# Patient Record
Sex: Female | Born: 1965 | Race: White | Hispanic: No | Marital: Married
Health system: Midwestern US, Community
[De-identification: ages and names within clinical notes are randomized; demographics above are authoritative.]

## PROBLEM LIST (undated history)

## (undated) DIAGNOSIS — R109 Unspecified abdominal pain: Secondary | ICD-10-CM

## (undated) DIAGNOSIS — H539 Unspecified visual disturbance: Secondary | ICD-10-CM

## (undated) DIAGNOSIS — E079 Disorder of thyroid, unspecified: Secondary | ICD-10-CM

## (undated) DIAGNOSIS — Q796 Ehlers-Danlos syndrome, unspecified: Secondary | ICD-10-CM

## (undated) DIAGNOSIS — K2970 Gastritis, unspecified, without bleeding: Secondary | ICD-10-CM

## (undated) DIAGNOSIS — F32A Depression, unspecified: Secondary | ICD-10-CM

## (undated) DIAGNOSIS — R079 Chest pain, unspecified: Secondary | ICD-10-CM

## (undated) DIAGNOSIS — H6990 Unspecified Eustachian tube disorder, unspecified ear: Secondary | ICD-10-CM

## (undated) DIAGNOSIS — G5750 Tarsal tunnel syndrome, unspecified lower limb: Secondary | ICD-10-CM

## (undated) DIAGNOSIS — M5136 Other intervertebral disc degeneration, lumbar region: Secondary | ICD-10-CM

## (undated) DIAGNOSIS — M5430 Sciatica, unspecified side: Secondary | ICD-10-CM

## (undated) DIAGNOSIS — M2550 Pain in unspecified joint: Secondary | ICD-10-CM

## (undated) DIAGNOSIS — M436 Torticollis: Secondary | ICD-10-CM

## (undated) DIAGNOSIS — F329 Major depressive disorder, single episode, unspecified: Secondary | ICD-10-CM

## (undated) DIAGNOSIS — K589 Irritable bowel syndrome without diarrhea: Secondary | ICD-10-CM

## (undated) DIAGNOSIS — R7989 Other specified abnormal findings of blood chemistry: Secondary | ICD-10-CM

## (undated) DIAGNOSIS — N939 Abnormal uterine and vaginal bleeding, unspecified: Secondary | ICD-10-CM

## (undated) DIAGNOSIS — Z01812 Encounter for preprocedural laboratory examination: Secondary | ICD-10-CM

## (undated) DIAGNOSIS — R52 Pain, unspecified: Secondary | ICD-10-CM

## (undated) HISTORY — DX: Unspecified eustachian tube disorder, unspecified ear: H69.90

## (undated) HISTORY — PX: HEMORRHOID SURGERY: SHX153

## (undated) HISTORY — DX: Chest pain, unspecified: R07.9

## (undated) HISTORY — DX: Major depressive disorder, single episode, unspecified: F32.9

## (undated) HISTORY — DX: Other intervertebral disc degeneration, lumbar region: M51.36

## (undated) HISTORY — PX: CHOLECYSTECTOMY: SHX55

## (undated) HISTORY — DX: Torticollis: M43.6

## (undated) HISTORY — DX: Gastritis, unspecified, without bleeding: K29.70

## (undated) HISTORY — DX: Tarsal tunnel syndrome, unspecified lower limb: G57.50

## (undated) HISTORY — DX: Other specified abnormal findings of blood chemistry: R79.89

## (undated) HISTORY — DX: Unspecified visual disturbance: H53.9

## (undated) HISTORY — PX: CORONARY ANGIOPLASTY: SHX604

## (undated) HISTORY — DX: Disorder of thyroid, unspecified: E07.9

## (undated) HISTORY — DX: Unspecified abdominal pain: R10.9

## (undated) HISTORY — DX: Sciatica, unspecified side: M54.30

## (undated) HISTORY — DX: Depression, unspecified: F32.A

## (undated) HISTORY — DX: Irritable bowel syndrome without diarrhea: K58.9

## (undated) HISTORY — DX: Pain in unspecified joint: M25.50

## (undated) HISTORY — PX: TONSILLECTOMY: SUR1361

## (undated) HISTORY — PX: TUBAL LIGATION: SHX77

## (undated) HISTORY — DX: Ehlers-Danlos syndrome, unspecified: Q79.60

---

## 1998-07-30 ENCOUNTER — Emergency Department (HOSPITAL_COMMUNITY): Admission: EM | Admit: 1998-07-30 | Discharge: 1998-07-30 | Payer: Self-pay | Admitting: Internal Medicine

## 2010-03-15 ENCOUNTER — Emergency Department (HOSPITAL_COMMUNITY): Admission: EM | Admit: 2010-03-15 | Discharge: 2010-03-15 | Payer: Self-pay | Admitting: Emergency Medicine

## 2011-02-27 LAB — COMPREHENSIVE METABOLIC PANEL
BUN: 15 mg/dL (ref 6–23)
CO2: 23 mEq/L (ref 19–32)
Chloride: 108 mEq/L (ref 96–112)
GFR calc non Af Amer: >60 mL/min (ref >60)
Glucose, Bld: 130 mg/dL — ABNORMAL HIGH (ref 70–99)
Potassium: 4 mEq/L (ref 3.5–5.1)
Sodium: 139 mEq/L (ref 135–145)
Total Bilirubin: 1.1 mg/dL (ref 0.3–1.2)
Total Protein: 7.5 g/dL (ref 6.0–8.3)

## 2011-10-25 ENCOUNTER — Ambulatory Visit
Admission: RE | Admit: 2011-10-25 | Discharge: 2011-10-25 | Disposition: A | Discharge status: Home / Self Care | Source: Ambulatory Visit | Attending: Family Medicine | Admitting: Family Medicine

## 2011-10-25 ENCOUNTER — Other Ambulatory Visit: Payer: Self-pay | Admitting: Family Medicine

## 2011-10-25 DIAGNOSIS — I2699 Other pulmonary embolism without acute cor pulmonale: Secondary | ICD-10-CM

## 2011-10-25 MED ORDER — IOHEXOL 300 MG/ML  SOLN
125.0000 mL | Freq: Once | INTRAMUSCULAR | Status: AC | PRN
  Administered 2011-10-25: 125 mL via INTRAVENOUS

## 2015-04-05 LAB — TSH: TSH: 0.79 u[IU]/mL (ref 0.41–5.90)

## 2015-12-18 ENCOUNTER — Encounter: Payer: Self-pay | Admitting: Internal Medicine

## 2015-12-19 ENCOUNTER — Encounter: Payer: Self-pay | Admitting: Internal Medicine

## 2015-12-19 ENCOUNTER — Ambulatory Visit (INDEPENDENT_AMBULATORY_CARE_PROVIDER_SITE_OTHER): Admitting: Internal Medicine

## 2015-12-19 VITALS — BP 114/68 | HR 88 | Temp 98.3°F | Resp 12 | Ht 67.0 in | Wt 210.8 lb

## 2015-12-19 DIAGNOSIS — E0789 Other specified disorders of thyroid: Secondary | ICD-10-CM | POA: Diagnosis not present

## 2015-12-19 DIAGNOSIS — R351 Nocturia: Secondary | ICD-10-CM

## 2015-12-19 DIAGNOSIS — E039 Hypothyroidism, unspecified: Secondary | ICD-10-CM | POA: Diagnosis not present

## 2015-12-19 LAB — T3, FREE: T3 FREE: 4.5 pg/mL — AB (ref 2.3–4.2)

## 2015-12-19 LAB — HEMOGLOBIN A1C: Hgb A1c MFr Bld: 5.5 % (ref 4.6–6.5)

## 2015-12-19 LAB — TSH: TSH: 0.63 u[IU]/mL (ref 0.35–4.50)

## 2015-12-19 LAB — T4, FREE: Free T4: 0.74 ng/dL (ref 0.60–1.60)

## 2015-12-19 NOTE — Progress Notes (Addendum)
Patient ID: Vickie Wood, female   DOB: Aug 10, 1966, 50 y.o.   MRN: [id number]   HPI  Vickie Wood is a 50 y.o.-year-old female, referred by her PCP, Dr Lysbeth Penner, for management of hypothyroidism. She saw endo before: Dr Robbie Louis.  Pt. has been dx with hypothyroidism in 2008 (fatigue, weight gain - 30 lbs over 3 mo, brain fog); is on Armour 120 mg. She was started initially on LT4 >> itching with different formulations.  Takes Armour: - fasting - with water - separated by >30 min from b'fast  - no calcium, iron, multivitamins  - prn PPIs  I reviewed pt's thyroid tests: Lab Results  Component Value Date   TSH 0.79 04/05/2015  04/05/2015: free T4  0.96 (0.82-1.77), free T3 3.8  (2-4.4) 06/24/2014: TSH 1.16, free T4 1.08 (0.82-1.77), free T3 6.1 (2-4.4)  Pt describes: - weight gain - fatigue - cold intolerance - depression - constipation - dry skin - hair loss - + nocturia x2  Pt denies feeling nodules in neck, hoarseness, dysphagia/odynophagia, SOB with lying down.  She has + FH of thyroid disorders in: GM and 2 cousins. No FH of thyroid cancer.  No h/o radiation tx to head or neck. No recent use of iodine supplements.  She has FH of DM2: grandfather and aunt.   ROS: Constitutional: + see HPI Eyes: no blurry vision, no xerophthalmia ENT: no sore throat, no nodules palpated in throat, + occasional dysphagia/no odynophagia, no hoarseness Cardiovascular: no CP/SOB/palpitations/leg swelling Respiratory: no cough/SOB Gastrointestinal: no N/V/D/+ C Musculoskeletal: no muscle/joint aches Skin: no rashes, + itching Neurological: no tremors/numbness/tingling/dizziness, + HA Psychiatric: + situational depression/no anxiety  Past Medical History  Diagnosis Date  . Thyroid disease   . Depressive disorder   . Tarsal tunnel syndrome   . Abnormal vision   . Eustachian tube disorder   . Gastritis   . IBS (irritable bowel syndrome)   . Joint pain     Multiple  .  Degeneration of lumbar intervertebral disc   . Intermittent torticollis   . Sciatica   . Chest pain   . Abdominal pain   . Abnormal C-reactive protein   Ehlers-Danlos sd.  Past Surgical History  Procedure Laterality Date  . Cesarean section    . Cholecystectomy    . Hemorrhoid surgery    . Tonsillectomy    . Tubal ligation     Social History   Social History  . Marital Status: Married    Spouse Name: N/A  . 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 Smoking status: Never Smoker   . Smokeless tobacco: Not on file  . Alcohol Use: 0.0 oz/week    0 Standard drinks or equivalent per week  . Drug Use: No   Current Outpatient Prescriptions on File Prior to Visit  Medication Sig Dispense Refill  . thyroid (ARMOUR) 120 MG tablet Take 120 mg by mouth daily before breakfast.     No current facility-administered medications on file prior to visit.   Allergies  Allergen Reactions  . Codeine Itching  . Synthroid [Levothyroxine Sodium] Itching    Uncontrollable    Family History  Problem Relation Age of Onset  . Heart disease Father   . Hypertension Mother   . Aneurysm Mother     Intracranial   . CVA Maternal Grandmother    Her daughter has a 9 year h/o Lyme ds, only recently dx'ed.  PE: BP 114/68 mmHg  Pulse 88  Temp(Src) 98.3 F (36.8 C) (Oral)  Resp 12  Ht 5\' 7"  (1.702 m)  Wt 210 lb 12.8 oz (95.618 kg)  BMI 33.01 kg/m2  SpO2 96% Wt Readings from Last 3 Encounters:  12/19/15 210 lb 12.8 oz (95.618 kg)   Constitutional: overweight, in NAD Eyes: PERRLA, EOMI, no exophthalmos ENT: moist mucous membranes, no thyromegaly, but L thyroid fulness, no cervical lymphadenopathy Cardiovascular: RRR, No MRG Respiratory: CTA B Gastrointestinal: abdomen soft, NT, ND, BS+ Musculoskeletal: no deformities, strength intact in all 4 Skin: moist, warm, no rashes Neurological: no tremor with outstretched hands, DTR  normal in all 4  ASSESSMENT: 1. Hypothyroidism  2. L Thyroid fulness  3. Nocturia  PLAN:  1. Patient with long-standing hypothyroidism, on Armour therapy. She appears euthyroid, but feels fatigued and is unhappy about her weight gain. She is interested to switch to a different desiccated thyroid extract and I suggested WP thyroid which is the purest. She agrees with the switch. - We discussed about correct intake of thyroid hh, fasting, with water, separated by at least 30 minutes from breakfast, and separated by more than 4 hours from calcium, iron, multivitamins, acid reflux medications (PPIs). She is taking it correctly. - will check thyroid tests today: TSH, free T4, free T3 and add TPO Abs to check for Hashimoto's thyroiditis - I explained that Hashimoto's thyroiditis is an autoimmune disease and the most common cause of hypothyroidism in [place]. - If these are abnormal, she will need to return in 6-8 weeks for repeat labs - If these are normal, I will see her back in 6 months  Needs 90 days supply sent to [facility].  2. L thyroid fulness - by palpation - + occasional dysphagia - she agrees to check a thyroid U/S >> ordered  3. Nocturia - has FH of DM2 - would like to be tested for DM - will check a HbA1c   Orders Placed This Encounter  Procedures  . US Soft Tissue Head/Neck  . T4, free  . TSH  . T3, free  . Hemoglobin A1c  . Thyroid peroxidase antibody    Component     Latest Ref Rng 12/19/2015  TSH     0.35 - 4.50 uIU/mL 0.63  Free T4     0.60 - 1.60 ng/dL 1.61  T3, Free     2.3 - 4.2 pg/mL 4.5 (H)  Hemoglobin A1C     4.6 - 6.5 % 5.5  Thyroperoxidase Ab SerPl-aCnc     <9 IU/mL 364 (H)   High TPO Ab's >> New dx of Hashimoto's thyroiditis. OTW, TFTs normal. fT4 a little high, which is not unusual during tx with desiccated thyroid extract. Will change to [facility] Thyroid as this is gluten-free, without coloring agents. Will start with the equivalent dose, 2 grains  (130 mg) daily. Will repeat TFTs in 5-6 weeks.  Thyroid U/S pending. I will addend the results when they become available.  CLINICAL DATA: Thyroid fullness  EXAM: THYROID ULTRASOUND  TECHNIQUE: Ultrasound examination of the thyroid gland and adjacent soft tissues was performed.  COMPARISON: None.  FINDINGS: There is moderate diffuse heterogeneity of thyroid parenchymal echotexture.  Right thyroid lobe  Measurements: Normal in size measuring 3.8 x 1.6 x 1.3 cm.  Right, inferior, posterior- 0.4 x 0.8 x 0.5 cm - hypoechoic, likely cystic  Left thyroid lobe  Measurements: Normal in size measuring 4.3 x 1.2 x 1.3 cm.  Left, inferior, exophytic -  0.6 x 0.6 x 0.4 cm - anechoic, likely cystic - it is unclear if this nodule is arising exophytically from the inferior aspect of the left lobe of the thyroid versus an adjacent discrete nodule / lymph node.  Isthmus  Thickness: Normal in size measuring 0.3 cm in diameter  No discrete nodule or mass identified within the thyroid isthmus.  Lymphadenopathy  None visualized.  IMPRESSION: Findings suggestive of multi nodular goiter. None of the discretely measured thyroid nodules currently meet imaging criteria to recommend percutaneous sampling.   Electronically Signed By: Simonne ComeJohn Watts M.D. On: 12/21/2015 11:38               I could not find Dr. Verlon AuHandshaw's name in the system to Route him the note.

## 2015-12-19 NOTE — Patient Instructions (Addendum)
Please stop at the lab.  Please continue Armour 120 mg for now.  Take the thyroid hormone every day, with water, at least 30 minutes before breakfast, separated by at least 4 hours from: - acid reflux medications - calcium - iron - multivitamins  Please come back for a follow-up appointment in 6 months.

## 2015-12-20 ENCOUNTER — Encounter: Payer: Self-pay | Admitting: *Deleted

## 2015-12-20 ENCOUNTER — Encounter: Payer: Self-pay | Admitting: Internal Medicine

## 2015-12-20 DIAGNOSIS — E039 Hypothyroidism, unspecified: Secondary | ICD-10-CM | POA: Insufficient documentation

## 2015-12-20 LAB — THYROID PEROXIDASE ANTIBODY: Thyroperoxidase Ab SerPl-aCnc: 364 IU/mL — ABNORMAL HIGH (ref <9)

## 2015-12-20 MED ORDER — WP THYROID 130 MG PO TABS: 130.0000 mg | ORAL_TABLET | Freq: Every day | ORAL | Status: DC

## 2015-12-21 ENCOUNTER — Ambulatory Visit
Admission: RE | Admit: 2015-12-21 | Discharge: 2015-12-21 | Disposition: A | Discharge status: Home / Self Care | Source: Ambulatory Visit | Attending: Internal Medicine | Admitting: Internal Medicine

## 2015-12-27 ENCOUNTER — Other Ambulatory Visit: Payer: Self-pay | Admitting: *Deleted

## 2015-12-27 MED ORDER — WP THYROID 130 MG PO TABS
130.0000 mg | ORAL_TABLET | Freq: Every day | ORAL | Status: DC
Start: 2015-12-27 — End: 2016-03-13

## 2015-12-27 NOTE — Telephone Encounter (Signed)
Per pt's request, resend [facility] Thyroid to [facility]. Pt still has not received it or heard from them. Done.

## 2016-01-17 ENCOUNTER — Telehealth: Payer: Self-pay | Admitting: Cardiovascular Disease

## 2016-01-17 NOTE — Telephone Encounter (Signed)
Received records from [facility] for appointment on 02/06/16 with Dr Duke Salvia.  Records given to [facility] (medical records) for Dr Leonides Sake schedule on 02/06/16. lp

## 2016-01-30 ENCOUNTER — Other Ambulatory Visit

## 2016-02-06 ENCOUNTER — Ambulatory Visit (INDEPENDENT_AMBULATORY_CARE_PROVIDER_SITE_OTHER)

## 2016-02-06 ENCOUNTER — Encounter: Payer: Self-pay | Admitting: Cardiovascular Disease

## 2016-02-06 ENCOUNTER — Ambulatory Visit (INDEPENDENT_AMBULATORY_CARE_PROVIDER_SITE_OTHER): Admitting: Cardiovascular Disease

## 2016-02-06 VITALS — BP 130/90 | HR 75 | Ht 67.0 in | Wt 214.2 lb

## 2016-02-06 DIAGNOSIS — R002 Palpitations: Secondary | ICD-10-CM

## 2016-02-06 DIAGNOSIS — Q796 Ehlers-Danlos syndrome, unspecified: Secondary | ICD-10-CM

## 2016-02-06 DIAGNOSIS — R079 Chest pain, unspecified: Secondary | ICD-10-CM | POA: Diagnosis not present

## 2016-02-06 DIAGNOSIS — R0602 Shortness of breath: Secondary | ICD-10-CM | POA: Diagnosis not present

## 2016-02-06 NOTE — Progress Notes (Signed)
Cardiology Office Note   Date:  02/08/2016   ID:  [name], DOB 12-07-1966, MRN [id number]  PCP:  No PCP Per Patient  Cardiologist:   Madilyn Hook, MD   Chief Complaint  Patient presents with  . New Evaluation    cardiac arrhythmia per dr rivard//patient reports shortness of breath with minimal exertion, swelling of hands, an occasional pain/tightness right (points right below sternum), and exhausted all the time. patient reports a family hx of cardiac arrhythmia (father and half-sister)      History of Present Illness: Vickie Wood is a 50 y.o. female with Vickie Wood and hypothyroidism who presents for evaluation of an irregular heart rhythm.  Vickie Wood saw her OB/GYN, Dr. Silverio Lay, on 01/11/16.  On exam she was noted to have an irregular heart rhythm.  She was referred to cardiology for further evaluation.  Vickie Wood reports feeling fatigued for the last year and a half.  She denies palpitations, lightheadedness,  nausea or diaphoresis.  She sometimes notes a sharp pain in her sternum that occurs both at rest and with exertion.  She has noticed shortness of breath for the last 3 months.  She doesn't remember being told she had an irregular heart rhythm in the past.  Her father was diagnosed with atrial fibrillation in his 24s and she has a half sister with an unknown arrhythmia.  She drinks coffee and tea but has significantly reduced her intake since finding out about the arrhythmia.   Past Medical History  Diagnosis Date  . Thyroid disease   . Depressive disorder   . Tarsal tunnel syndrome   . Abnormal vision   . Eustachian tube disorder   . Gastritis   . IBS (irritable bowel syndrome)   . Joint pain     Multiple  . Degeneration of lumbar intervertebral disc   . Intermittent torticollis   . Sciatica   . Chest pain   . Abdominal pain   . Abnormal C-reactive protein   . Ehlers-Danlos disease 02/08/2016    Past Surgical History  Procedure Laterality Date  .  Cesarean section    . Cholecystectomy    . Hemorrhoid surgery    . Tonsillectomy    . Tubal ligation       Current Outpatient Prescriptions  Medication Sig Dispense Refill  . WP THYROID 130 MG tablet Take 1 tablet (130 mg total) by mouth daily. 90 tablet 1   No current facility-administered medications for this visit.    Allergies:   Codeine and Synthroid    Social History:  The patient  reports that she has never smoked. She does not have any smokeless tobacco history on file. She reports that she drinks alcohol. She reports that she does not use illicit drugs.   Family History:  The patient's family history includes Aneurysm in her mother; Arrhythmia in her father; CVA in her maternal grandmother; Heart disease in her father; Hypertension in her mother.    ROS:  Please see the history of present illness.   Otherwise, review of systems are positive for none.   All other systems are reviewed and negative.    PHYSICAL EXAM: VS:  BP 130/90 mmHg  Pulse 75  Ht  (1.702 m)  Wt 97.16 kg (214 lb 3.2 oz)  BMI 33.54 kg/m2 , BMI Body mass index is 33.54 kg/(m^2). GENERAL:  Well appearing HEENT:  Pupils equal round and reactive, fundi not visualized, oral mucosa unremarkable NECK:  No jugular venous distention, waveform within normal limits, carotid upstroke brisk and symmetric, no bruits, no thyromegaly LYMPHATICS:  No cervical adenopathy LUNGS:  Clear to auscultation bilaterally HEART:  RRR.  PMI not displaced or sustained,S1 and S2 within normal limits, no S3, no S4, no clicks, no rubs, no murmurs ABD:  Flat, positive bowel sounds normal in frequency in pitch, no bruits, no rebound, no guarding, no midline pulsatile mass, no hepatomegaly, no splenomegaly EXT:  2 plus pulses throughout, no edema, no cyanosis no clubbing SKIN:  No rashes no nodules NEURO:  Cranial nerves II through XII grossly intact, motor grossly intact throughout PSYCH:  Cognitively intact, oriented to person  place and time   EKG:  EKG is ordered today. The ekg ordered today demonstrates sinus rhythm.  Rate 75 bpm.     Recent Labs: 12/19/2015: TSH 0.63    Lipid Panel No results found for: CHOL, TRIG, HDL, CHOLHDL, VLDL, LDLCALC, LDLDIRECT    Wt Readings from Last 3 Encounters:  02/06/16 97.16 kg (214 lb 3.2 oz)  12/19/15 95.618 kg (210 lb 12.8 oz)      ASSESSMENT AND PLAN:  # Irregular heart rate: Ms. Rankin does not report palpitations but her OB/GYN noted irregularity on exam.  Given that her father was diagnosed with atrial fibrillation in his 50s, we will obtain a 30 day Event Monitor to evaluate for arrhythmias. Her thyroid function has been stable.  We will also check a basic metabolic panel, CBC, and magnesium.  # Chest pain, shortness of breath:  Vickie Wood symptoms are atypical.  However, we will obtain a Lexiscan Cardiolite to evaluate for ischemia.  # Carylon Perches Danlos: Echo to evaluate for MVP or abnormalities of the aorta.   Current medicines are reviewed at length with the patient today.  The patient does not have concerns regarding medicines.  The following changes have been made:  no change  Labs/ tests ordered today include:   Orders Placed This Encounter  Procedures  . Basic metabolic panel  . CBC  . Magnesium  . Myocardial Perfusion Imaging  . Cardiac event monitor  . EKG 12-Lead  . ECHOCARDIOGRAM COMPLETE     Disposition:   FU with Ylonda Storr C. Duke Salvia, MD, [facility] in 3 months.   This note was written with the assistance of speech recognition software.  Please excuse any transcriptional errors.  Signed, Vickie Wood C. Duke Salvia, MD, [facility]  02/08/2016 9:49 PM    [facility]

## 2016-02-06 NOTE — Patient Instructions (Signed)
Medication Instructions: Dr Duke Salvia recommends that you continue on your current medications as directed. Please refer to the Current Medication list given to you today.  Labwork: Your physician recommends that you return for lab work TODAY.  Testing/Procedures: 1. Echocardiogram - Your physician has requested that you have an echocardiogram. Echocardiography is a painless test that uses sound waves to create images of your heart. It provides your doctor with information about the size and shape of your heart and how well your heart's chambers and valves are working. This procedure takes approximately one hour. There are no restrictions for this procedure.  2. Lexiscan Cardiolite stress test - Your physician has requested that you have a lexiscan myoview. For further information please visit [email or web address] Please follow instruction sheet, as given.  3. 30-Day Cardiac Event Monitor - Your physician has recommended that you wear an event monitor. Event monitors are medical devices that record the heart's electrical activity. Doctors most often [place] these monitors to diagnose arrhythmias. Arrhythmias are problems with the speed or rhythm of the heartbeat. The monitor is a small, portable device. You can wear one while you do your normal daily activities. This is usually used to diagnose what is causing palpitations/syncope (passing out). This will be mailed to you for you to put on at your convenience.  Follow-up: Dr Duke Salvia recommends that you schedule a follow-up appointment in 3 months.  If you need a refill on your cardiac medications before your next appointment, please call your pharmacy.

## 2016-02-08 ENCOUNTER — Encounter: Payer: Self-pay | Admitting: Cardiovascular Disease

## 2016-02-08 DIAGNOSIS — Q796 Ehlers-Danlos syndrome, unspecified: Secondary | ICD-10-CM

## 2016-02-08 HISTORY — DX: Ehlers-Danlos syndrome, unspecified: Q79.60

## 2016-02-12 ENCOUNTER — Telehealth: Payer: Self-pay | Admitting: Cardiovascular Disease

## 2016-02-12 NOTE — Telephone Encounter (Signed)
New message  Pt request a call back to discuss if her labs that are being drawn at [facility] can be used for her heart care needs. Please call back to discus in detail

## 2016-02-13 ENCOUNTER — Other Ambulatory Visit (INDEPENDENT_AMBULATORY_CARE_PROVIDER_SITE_OTHER)

## 2016-02-13 DIAGNOSIS — E039 Hypothyroidism, unspecified: Secondary | ICD-10-CM | POA: Diagnosis not present

## 2016-02-13 LAB — TSH: TSH: 3.13 u[IU]/mL (ref 0.35–4.50)

## 2016-02-13 LAB — T3, FREE: T3, Free: 4.8 pg/mL — ABNORMAL HIGH (ref 2.3–4.2)

## 2016-02-13 LAB — T4, FREE: Free T4: 0.73 ng/dL (ref 0.60–1.60)

## 2016-02-14 LAB — CBC
HEMATOCRIT: 41.4 % (ref 36.0–46.0)
Hemoglobin: 14.2 g/dL (ref 12.0–15.0)
MCH: 31.6 pg (ref 26.0–34.0)
MCHC: 34.3 g/dL (ref 30.0–36.0)
MCV: 92.2 fL (ref 78.0–100.0)
MPV: 10.3 fL (ref 8.6–12.4)
Platelets: 347 10*3/uL (ref 150–400)
RBC: 4.49 MIL/uL (ref 3.87–5.11)
RDW: 14 % (ref 11.5–15.5)
WBC: 8.4 10*3/uL (ref 4.0–10.5)

## 2016-02-14 LAB — BASIC METABOLIC PANEL
BUN: 12 mg/dL (ref 7–25)
CO2: 22 mmol/L (ref 20–31)
Calcium: 9.6 mg/dL (ref 8.6–10.2)
Chloride: 103 mmol/L (ref 98–110)
Creat: 0.8 mg/dL (ref 0.50–1.10)
GLUCOSE: 86 mg/dL (ref 65–99)
POTASSIUM: 4.6 mmol/L (ref 3.5–5.3)
Sodium: 136 mmol/L (ref 135–146)

## 2016-02-14 LAB — MAGNESIUM: Magnesium: 2 mg/dL (ref 1.5–2.5)

## 2016-02-16 ENCOUNTER — Telehealth: Payer: Self-pay | Admitting: Cardiovascular Disease

## 2016-02-16 NOTE — Telephone Encounter (Signed)
Spoke with pt, she reports as soon as she put the heart monitor on she developed a headache. She reports a burning type sensation in her head that is making her squint. She has a hx of headaches but states this one is different than her usual. She denies any other symptoms. Discussed with dr Duke Salviarandolph, no sure related to the monitor. Aware do not think headache is due to monitor. She will call with continued problems.

## 2016-02-27 ENCOUNTER — Telehealth (HOSPITAL_COMMUNITY): Payer: Self-pay | Admitting: *Deleted

## 2016-02-27 NOTE — Telephone Encounter (Signed)
Patient given detailed instructions per Myocardial Perfusion Study Information Sheet for the test on 02/29/16 at 1000. Patient notified to arrive 15 minutes early and that it is imperative to arrive on time for appointment to keep from having the test rescheduled.  If you need to cancel or reschedule your appointment, please call the office within 24 hours of your appointment. Failure to do so may result in a cancellation of your appointment, and a $50 no show fee. Patient verbalized understanding.Prisma Decarlo, Adelene IdlerCynthia W

## 2016-02-29 ENCOUNTER — Ambulatory Visit (HOSPITAL_COMMUNITY): Attending: Cardiovascular Disease

## 2016-02-29 ENCOUNTER — Other Ambulatory Visit: Payer: Self-pay

## 2016-02-29 ENCOUNTER — Ambulatory Visit (HOSPITAL_BASED_OUTPATIENT_CLINIC_OR_DEPARTMENT_OTHER)

## 2016-02-29 DIAGNOSIS — R9439 Abnormal result of other cardiovascular function study: Secondary | ICD-10-CM | POA: Diagnosis not present

## 2016-02-29 DIAGNOSIS — R079 Chest pain, unspecified: Secondary | ICD-10-CM | POA: Diagnosis present

## 2016-02-29 DIAGNOSIS — I5189 Other ill-defined heart diseases: Secondary | ICD-10-CM | POA: Insufficient documentation

## 2016-02-29 DIAGNOSIS — I340 Nonrheumatic mitral (valve) insufficiency: Secondary | ICD-10-CM | POA: Diagnosis not present

## 2016-02-29 DIAGNOSIS — I517 Cardiomegaly: Secondary | ICD-10-CM | POA: Diagnosis not present

## 2016-02-29 DIAGNOSIS — R0602 Shortness of breath: Secondary | ICD-10-CM | POA: Insufficient documentation

## 2016-02-29 DIAGNOSIS — I071 Rheumatic tricuspid insufficiency: Secondary | ICD-10-CM | POA: Diagnosis not present

## 2016-02-29 LAB — MYOCARDIAL PERFUSION IMAGING
CHL CUP NUCLEAR SRS: 14
CHL CUP NUCLEAR SSS: 16
CSEPPHR: 110 {beats}/min
LHR: 0.36
LV dias vol: 102 mL (ref 46–106)
LVSYSVOL: 40 mL
NUC STRESS TID: 0.95
Rest HR: 77 {beats}/min
SDS: 2

## 2016-02-29 MED ORDER — REGADENOSON 0.4 MG/5ML IV SOLN
0.4000 mg | Freq: Once | INTRAVENOUS | Status: AC
  Administered 2016-02-29: 0.4 mg via INTRAVENOUS

## 2016-02-29 MED ORDER — TECHNETIUM TC 99M SESTAMIBI GENERIC - CARDIOLITE
32.8000 | Freq: Once | INTRAVENOUS | Status: AC | PRN
  Administered 2016-02-29: 32.8 via INTRAVENOUS

## 2016-02-29 MED ORDER — TECHNETIUM TC 99M SESTAMIBI GENERIC - CARDIOLITE
10.6000 | Freq: Once | INTRAVENOUS | Status: AC | PRN
Start: 2016-02-29 — End: 2016-02-29
  Administered 2016-02-29: 11 via INTRAVENOUS

## 2016-02-29 MED ORDER — AMINOPHYLLINE 25 MG/ML IV SOLN
75.0000 mg | Freq: Once | INTRAVENOUS | Status: AC
  Administered 2016-02-29: 75 mg via INTRAVENOUS

## 2016-03-04 ENCOUNTER — Telehealth: Payer: Self-pay | Admitting: Cardiovascular Disease

## 2016-03-04 DIAGNOSIS — R9439 Abnormal result of other cardiovascular function study: Secondary | ICD-10-CM

## 2016-03-04 NOTE — Telephone Encounter (Signed)
Results released to mychart, pt had not yet received a call about these and had questions about the results. i answered as best as I was able - she would probably still benefit from a follow up phone call.  Pt aware I have put in the order for the cardiac CT & [place] imaging scheduler will call.

## 2016-03-04 NOTE — Telephone Encounter (Signed)
Pt had 2 test last week. She saw it on My-Chart,but she is still confused about what it was saying.

## 2016-03-05 NOTE — Telephone Encounter (Signed)
Spoke with patient and she will await call from scheduling and proceed with CT-A

## 2016-03-06 ENCOUNTER — Encounter: Payer: Self-pay | Admitting: Cardiovascular Disease

## 2016-03-07 ENCOUNTER — Inpatient Hospital Stay: Admission: RE | Admit: 2016-03-07 | Source: Ambulatory Visit

## 2016-03-12 ENCOUNTER — Encounter (HOSPITAL_COMMUNITY): Payer: Self-pay

## 2016-03-12 ENCOUNTER — Ambulatory Visit (HOSPITAL_COMMUNITY)
Admission: RE | Admit: 2016-03-12 | Discharge: 2016-03-12 | Disposition: A | Discharge status: Home / Self Care | Source: Ambulatory Visit | Attending: Cardiovascular Disease | Admitting: Cardiovascular Disease

## 2016-03-12 DIAGNOSIS — I2510 Atherosclerotic heart disease of native coronary artery without angina pectoris: Secondary | ICD-10-CM | POA: Insufficient documentation

## 2016-03-12 DIAGNOSIS — R9439 Abnormal result of other cardiovascular function study: Secondary | ICD-10-CM | POA: Diagnosis not present

## 2016-03-12 MED ORDER — NITROGLYCERIN 0.4 MG SL SUBL
0.8000 mg | SUBLINGUAL_TABLET | Freq: Once | SUBLINGUAL | Status: AC
  Administered 2016-03-12: 0.8 mg via SUBLINGUAL
  Filled 2016-03-12: qty 25

## 2016-03-12 MED ORDER — NITROGLYCERIN 0.4 MG SL SUBL
SUBLINGUAL_TABLET | SUBLINGUAL | Status: AC
  Filled 2016-03-12: qty 2

## 2016-03-12 MED ORDER — METOPROLOL TARTRATE 1 MG/ML IV SOLN
5.0000 mg | Freq: Once | INTRAVENOUS | Status: AC
  Administered 2016-03-12: 5 mg via INTRAVENOUS
  Filled 2016-03-12: qty 5

## 2016-03-12 MED ORDER — IOPAMIDOL (ISOVUE-370) INJECTION 76%
INTRAVENOUS | Status: AC
  Administered 2016-03-12: 80 mL
  Filled 2016-03-12: qty 100

## 2016-03-12 MED ORDER — METOPROLOL TARTRATE 1 MG/ML IV SOLN
INTRAVENOUS | Status: AC
  Filled 2016-03-12: qty 5

## 2016-03-13 ENCOUNTER — Other Ambulatory Visit: Payer: Self-pay | Admitting: *Deleted

## 2016-03-13 ENCOUNTER — Telehealth: Payer: Self-pay | Admitting: Cardiovascular Disease

## 2016-03-13 ENCOUNTER — Other Ambulatory Visit: Payer: Self-pay

## 2016-03-13 DIAGNOSIS — R0602 Shortness of breath: Secondary | ICD-10-CM

## 2016-03-13 DIAGNOSIS — R931 Abnormal findings on diagnostic imaging of heart and coronary circulation: Secondary | ICD-10-CM

## 2016-03-13 DIAGNOSIS — Z01810 Encounter for preprocedural cardiovascular examination: Secondary | ICD-10-CM

## 2016-03-13 DIAGNOSIS — Z79899 Other long term (current) drug therapy: Secondary | ICD-10-CM

## 2016-03-13 NOTE — Telephone Encounter (Addendum)
Returned call. 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  I do see Dr. Leonides Sakeandolph's notes recommending cath. Gave recommendations to patient - she is aware we will call to schedule cath.

## 2016-03-13 NOTE — Telephone Encounter (Signed)
NewMessage  Pt requested to speak w/ RN cocnerning test she had yesterday 4/4. Please call back and discuss.

## 2016-03-13 NOTE — Telephone Encounter (Signed)
Deedee called to give pt pre-cath instructions - pt scheduled for cath tomorrow, plan for her to arrive early for labwork.  Pt voiced understanding, and is trying to make sure husband will be there. She will call back to confirm.  Called Trish and she is aware patient on schedule & of situation.

## 2016-03-13 NOTE — Telephone Encounter (Signed)
Cath orders entered by BelgiumJenna.

## 2016-03-14 LAB — BASIC METABOLIC PANEL
BUN: 11 mg/dL (ref 7–25)
CALCIUM: 9.2 mg/dL (ref 8.6–10.2)
CHLORIDE: 103 mmol/L (ref 98–110)
CO2: 24 mmol/L (ref 20–31)
CREATININE: 0.79 mg/dL (ref 0.50–1.10)
GLUCOSE: 84 mg/dL (ref 65–99)
Potassium: 4.5 mmol/L (ref 3.5–5.3)
Sodium: 137 mmol/L (ref 135–146)

## 2016-03-14 LAB — CBC
HEMATOCRIT: 41.3 % (ref 35.0–45.0)
HEMOGLOBIN: 13.7 g/dL (ref 11.7–15.5)
MCH: 30.7 pg (ref 27.0–33.0)
MCHC: 33.2 g/dL (ref 32.0–36.0)
MCV: 92.6 fL (ref 80.0–100.0)
MPV: 10.2 fL (ref 7.5–12.5)
Platelets: 371 10*3/uL (ref 140–400)
RBC: 4.46 MIL/uL (ref 3.80–5.10)
RDW: 13.5 % (ref 11.0–15.0)
WBC: 9.2 10*3/uL (ref 3.8–10.8)

## 2016-03-14 LAB — TSH: TSH: 2.86 mIU/L

## 2016-03-15 ENCOUNTER — Telehealth: Payer: Self-pay | Admitting: Physician Assistant

## 2016-03-15 ENCOUNTER — Encounter (HOSPITAL_COMMUNITY)
Admission: RE | Disposition: A | Discharge status: Home / Self Care | Payer: Self-pay | Source: Ambulatory Visit | Attending: Cardiovascular Disease

## 2016-03-15 ENCOUNTER — Ambulatory Visit (HOSPITAL_COMMUNITY)
Admission: RE | Admit: 2016-03-15 | Discharge: 2016-03-15 | Disposition: A | Discharge status: Home / Self Care | Source: Ambulatory Visit | Attending: Cardiovascular Disease | Admitting: Cardiovascular Disease

## 2016-03-15 DIAGNOSIS — R0602 Shortness of breath: Secondary | ICD-10-CM | POA: Insufficient documentation

## 2016-03-15 DIAGNOSIS — R9439 Abnormal result of other cardiovascular function study: Secondary | ICD-10-CM | POA: Insufficient documentation

## 2016-03-15 DIAGNOSIS — R931 Abnormal findings on diagnostic imaging of heart and coronary circulation: Secondary | ICD-10-CM | POA: Diagnosis not present

## 2016-03-15 DIAGNOSIS — E039 Hypothyroidism, unspecified: Secondary | ICD-10-CM | POA: Diagnosis not present

## 2016-03-15 HISTORY — PX: CARDIAC CATHETERIZATION: SHX172

## 2016-03-15 LAB — PROTIME-INR
INR: 0.96 (ref <1.50)
Prothrombin Time: 12.9 seconds (ref 11.6–15.2)

## 2016-03-15 LAB — APTT: aPTT: 29 seconds (ref 24–37)

## 2016-03-15 SURGERY — LEFT HEART CATH AND CORONARY ANGIOGRAPHY

## 2016-03-15 MED ORDER — SODIUM CHLORIDE 0.9% FLUSH: 3.0000 mL | INTRAVENOUS | Status: DC | PRN

## 2016-03-15 MED ORDER — FENTANYL CITRATE (PF) 100 MCG/2ML IJ SOLN
INTRAMUSCULAR | Status: AC
  Filled 2016-03-15: qty 2

## 2016-03-15 MED ORDER — SODIUM CHLORIDE 0.9 % IV SOLN: 250.0000 mL | INTRAVENOUS | Status: DC | PRN

## 2016-03-15 MED ORDER — AMLODIPINE BESYLATE 2.5 MG PO TABS: 2.5000 mg | ORAL_TABLET | Freq: Every day | ORAL | Status: DC

## 2016-03-15 MED ORDER — VERAPAMIL HCL 2.5 MG/ML IV SOLN
INTRAVENOUS | Status: DC | PRN
  Administered 2016-03-15 (×2): 10 mL via INTRA_ARTERIAL

## 2016-03-15 MED ORDER — LIDOCAINE HCL (PF) 1 % IJ SOLN
INTRAMUSCULAR | Status: DC | PRN
  Administered 2016-03-15: 5 mL via SUBCUTANEOUS

## 2016-03-15 MED ORDER — ASPIRIN 81 MG PO CHEW: 81.0000 mg | CHEWABLE_TABLET | Freq: Every day | ORAL | Status: DC

## 2016-03-15 MED ORDER — HEPARIN (PORCINE) IN NACL 2-0.9 UNIT/ML-% IJ SOLN
INTRAMUSCULAR | Status: AC
  Filled 2016-03-15: qty 1000

## 2016-03-15 MED ORDER — SODIUM CHLORIDE 0.9 % IV SOLN
INTRAVENOUS | Status: DC
  Administered 2016-03-15: 12:00:00 via INTRAVENOUS

## 2016-03-15 MED ORDER — LIDOCAINE HCL (PF) 1 % IJ SOLN
INTRAMUSCULAR | Status: AC
  Filled 2016-03-15: qty 30

## 2016-03-15 MED ORDER — NITROGLYCERIN 1 MG/10 ML FOR IR/CATH LAB
INTRA_ARTERIAL | Status: AC
  Filled 2016-03-15: qty 10

## 2016-03-15 MED ORDER — MIDAZOLAM HCL 2 MG/2ML IJ SOLN
INTRAMUSCULAR | Status: AC
  Filled 2016-03-15: qty 2

## 2016-03-15 MED ORDER — ASPIRIN 81 MG PO CHEW
81.0000 mg | CHEWABLE_TABLET | ORAL | Status: AC
  Administered 2016-03-15: 81 mg via ORAL

## 2016-03-15 MED ORDER — ASPIRIN 81 MG PO CHEW
CHEWABLE_TABLET | ORAL | Status: AC
  Filled 2016-03-15: qty 1

## 2016-03-15 MED ORDER — HEPARIN SODIUM (PORCINE) 1000 UNIT/ML IJ SOLN
INTRAMUSCULAR | Status: AC
  Filled 2016-03-15: qty 1

## 2016-03-15 MED ORDER — ONDANSETRON HCL 4 MG/2ML IJ SOLN: 4.0000 mg | Freq: Four times a day (QID) | INTRAMUSCULAR | Status: DC | PRN

## 2016-03-15 MED ORDER — MIDAZOLAM HCL 2 MG/2ML IJ SOLN
INTRAMUSCULAR | Status: AC
Start: 2016-03-15 — End: 2016-03-15
  Filled 2016-03-15: qty 2

## 2016-03-15 MED ORDER — MIDAZOLAM HCL 2 MG/2ML IJ SOLN
INTRAMUSCULAR | Status: DC | PRN
  Administered 2016-03-15 (×3): 1 mg via INTRAVENOUS
  Administered 2016-03-15: 2 mg via INTRAVENOUS

## 2016-03-15 MED ORDER — FENTANYL CITRATE (PF) 100 MCG/2ML IJ SOLN
INTRAMUSCULAR | Status: DC | PRN
  Administered 2016-03-15: 25 ug via INTRAVENOUS
  Administered 2016-03-15 (×2): 50 ug via INTRAVENOUS

## 2016-03-15 MED ORDER — HEPARIN (PORCINE) IN NACL 2-0.9 UNIT/ML-% IJ SOLN
INTRAMUSCULAR | Status: DC | PRN
  Administered 2016-03-15: 1000 mL

## 2016-03-15 MED ORDER — HEPARIN SODIUM (PORCINE) 1000 UNIT/ML IJ SOLN
INTRAMUSCULAR | Status: DC | PRN
  Administered 2016-03-15: 5000 [IU] via INTRAVENOUS

## 2016-03-15 MED ORDER — VERAPAMIL HCL 2.5 MG/ML IV SOLN
INTRAVENOUS | Status: AC
  Filled 2016-03-15: qty 2

## 2016-03-15 MED ORDER — ACETAMINOPHEN 325 MG PO TABS: 650.0000 mg | ORAL_TABLET | ORAL | Status: DC | PRN

## 2016-03-15 MED ORDER — IOPAMIDOL (ISOVUE-370) INJECTION 76%
INTRAVENOUS | Status: DC | PRN
  Administered 2016-03-15: 130 mL via INTRA_ARTERIAL

## 2016-03-15 MED ORDER — SODIUM CHLORIDE 0.9 % IV SOLN: INTRAVENOUS | Status: DC

## 2016-03-15 MED ORDER — SODIUM CHLORIDE 0.9% FLUSH: 3.0000 mL | Freq: Two times a day (BID) | INTRAVENOUS | Status: DC

## 2016-03-15 SURGICAL SUPPLY — 17 items
CATH INFINITI 5 FR JL3.5 (CATHETERS) ×2 IMPLANT
CATH INFINITI 5FR ANG PIGTAIL (CATHETERS) ×2 IMPLANT
CATH INFINITI 5FR JL4 (CATHETERS) ×2 IMPLANT
CATH INFINITI JR4 5F (CATHETERS) ×2 IMPLANT
CATH LAUNCHER 5F RADL (CATHETERS) ×1 IMPLANT
CATH OPTITORQUE JACKY 4.0 5F (CATHETERS) ×2 IMPLANT
CATH SITESEER 5F NTR (CATHETERS) ×2 IMPLANT
CATHETER LAUNCHER 5F RADL (CATHETERS) ×2
GLIDESHEATH SLEND A-KIT 6F 22G (SHEATH) IMPLANT
GLIDESHEATH SLEND SS 6F .021 (SHEATH) ×2 IMPLANT
KIT HEART LEFT (KITS) ×2 IMPLANT
PACK CARDIAC CATHETERIZATION (CUSTOM PROCEDURE TRAY) ×2 IMPLANT
SYR MEDRAD MARK V 150ML (SYRINGE) ×2 IMPLANT
TRANSDUCER W/STOPCOCK (MISCELLANEOUS) ×2 IMPLANT
TUBING CIL FLEX 10 FLL-RA (TUBING) ×2 IMPLANT
WIRE HI TORQ VERSACORE-J 145CM (WIRE) ×2 IMPLANT
WIRE SAFE-T 1.5MM-J .035X260CM (WIRE) ×2 IMPLANT

## 2016-03-15 NOTE — Discharge Instructions (Signed)
Start norvasc 2.5 mg daily Aspirin 81 mg daily Radial Site Care Refer to this sheet in the next few weeks. These instructions provide you with information about caring for yourself after your procedure. Your health care provider may also give you more specific instructions. Your treatment has been planned according to current medical practices, but problems sometimes occur. Call your health care provider if you have any problems or questions after your procedure. WHAT TO EXPECT AFTER THE PROCEDURE After your procedure, it is typical to have the following:  Bruising at the radial site that usually fades within 1-2 weeks.  Blood collecting in the tissue (hematoma) that may be painful to the touch. It should usually decrease in size and tenderness within 1-2 weeks. HOME CARE INSTRUCTIONS  Take medicines only as directed by your health care provider.  You may shower 24-48 hours after the procedure or as directed by your health care provider. Remove the bandage (dressing) and gently wash the site with plain soap and water. Pat the area dry with a clean towel. Do not rub the site, because this may cause bleeding.  Do not take baths, swim, or use a hot tub until your health care provider approves.  Check your insertion site every day for redness, swelling, or drainage.  Do not apply powder or lotion to the site.  Do not flex or bend the affected arm for 24 hours or as directed by your health care provider.  Do not push or pull heavy objects with the affected arm for 24 hours or as directed by your health care provider.  Do not lift over 10 lb (4.5 kg) for 5 days after your procedure or as directed by your health care provider.  Ask your health care provider when it is okay to:  Return to work or school.  Resume usual physical activities or sports.  Resume sexual activity.  Do not drive home if you are discharged the same day as the procedure. Have someone else drive you.  You may drive  24 hours after the procedure unless otherwise instructed by your health care provider.  Do not operate machinery or power tools for 24 hours after the procedure.  If your procedure was done as an outpatient procedure, which means that you went home the same day as your procedure, a responsible adult should be with you for the first 24 hours after you arrive home.  Keep all follow-up visits as directed by your health care provider. This is important. SEEK MEDICAL CARE IF:  You have a fever.  You have chills.  You have increased bleeding from the radial site. Hold pressure on the site. CALL 911 SEEK IMMEDIATE MEDICAL CARE IF:  You have unusual pain at the radial site.  You have redness, warmth, or swelling at the radial site.  You have drainage (other than a small amount of blood on the dressing) from the radial site.  The radial site is bleeding, and the bleeding does not stop after 30 minutes of holding steady pressure on the site.  Your arm or hand becomes pale, cool, tingly, or numb.   This information is not intended to replace advice given to you by your health care provider. Make sure you discuss any questions you have with your health care provider.   Document Released: 12/28/2010 Document Revised: 12/16/2014 Document Reviewed: 06/13/2014 Elsevier Interactive Patient Education [employer]2016 Elsevier Inc.

## 2016-03-15 NOTE — Interval H&P Note (Signed)
Cath Lab Visit (complete for each Cath Lab visit)  Clinical Evaluation Leading to the Procedure:   ACS: No.  Non-ACS:    Anginal Classification: CCS III  Anti-ischemic medical therapy: No Therapy  Non-Invasive Test Results: High-risk stress test findings: cardiac mortality >3%/year  Prior CABG: No previous CABG      History and Physical Interval Note:  03/15/2016 1:28 PM  Vickie Wood  has presented today for surgery, with the diagnosis of stenosis/abnormal nuc  The various methods of treatment have been discussed with the patient and family. After consideration of risks, benefits and other options for treatment, the patient has consented to  Procedure(s): Left Heart Cath and Coronary Angiography (N/A) as a surgical intervention .  The patient's history has been reviewed, patient examined, no change in status, stable for surgery.  I have reviewed the patient's chart and labs.  Questions were answered to the patient's satisfaction.     Galileo Colello A

## 2016-03-15 NOTE — H&P (Signed)
Patient ID: Vickie Wood MRN: [id number], DOB/AGE: 08/31/1966   Admit date: 03/15/2016  Primary Physician: No PCP Per Patient Primary Cardiologist: Dr. Duke Salviaandolph  Pt. Profile:  Vickie Wood is a 50 y.o. female with a history of Ehlers Danlos, hypothyroidism and recent abnormal Cardiac CT-A who [facility] for outpatient cardiac cath.   She saw Dr. Duke Salviaandolph in the office on 02/06/16 for evaluation of an irregular heart rhythm noted by her OBGYN. She reported some atypical chest pain and SOB and a Lexiscan myoview was ordered. A TTE also ordered to evaluate for MVP or abnormalities of the aorta given her hx of Ehlers Danlos. Finally, a 30 day event monitor was placed to evaluate for "irrregular heartbeat."  2D ECHO showed: Normal LV systolic function; grade 1 diastolic dysfunction; trace MR and TR.  Lexiscan MV showed: EF 60%, large size, severe intensity fixed apical and septal perfusion defects. Suspect artifact (possibly apical truncation) more than scar given normal LVEF of 60% with normal wall motion. No significant reversible ischemia. However, given the size of the defect, this is an intermediate risk study. Clinical correlation is advised.  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 Cardiac cath was recommended.  [facility]. She is feeling cold but otherwise okay.   Problem List  Past Medical History  Diagnosis Date  . Thyroid disease   . Depressive disorder   . Tarsal tunnel syndrome   . Abnormal vision   . Eustachian tube disorder   . Gastritis   . IBS (irritable bowel syndrome)   . Joint pain     Multiple  . Degeneration of lumbar intervertebral disc   . Intermittent torticollis     . Sciatica   . Chest pain   . Abdominal pain   . Abnormal C-reactive protein   . Ehlers-Danlos disease 02/08/2016    Past Surgical History  Procedure Laterality Date  . Cesarean section    . Cholecystectomy    . Hemorrhoid surgery    . Tonsillectomy    . Tubal ligation       Allergies  Allergies  Allergen Reactions  . Codeine Itching    Uncontrollable  . Synthroid [Levothyroxine Sodium] Itching    Uncontrollable      Home Medications  Prior to Admission medications   Medication Sig Start Date End Date Taking? Authorizing Provider  ARMOUR THYROID 90 MG tablet Take 90 mg by mouth daily.  02/23/16  Yes Historical Provider, MD  ketorolac (TORADOL) 10 MG tablet Take 10 mg by mouth every 6 (six) hours as needed for moderate pain.  02/23/16  Yes Historical Provider, MD  TIROSINT 25 MCG CAPS 25 mcg every morning. 02/26/16  Yes Historical Provider, MD  Vitamin D, Ergocalciferol, (DRISDOL) 50000 units CAPS capsule Take 10,000 Units by mouth 2 (two) times a week. Wed and Sun 03/05/16  Yes Historical Provider, MD    Family History  Family History  Problem Relation Age of Onset  . Heart disease Father   . Arrhythmia Father   . Hypertension Mother   . Aneurysm Mother     Intracranial   . CVA Maternal Grandmother    Family Status  Relation Status Death Age  .  Father Deceased      Social History  Social History   Social History  . Marital Status: Married    Spouse Name: N/A  . Number of Children: N/A  . Years of Education: N/A   Occupational History  . Not on file.   Social History Main Topics  . Smoking status: Never Smoker   . Smokeless tobacco: Not on file  . Alcohol Use: 0.0 oz/week    0 Standard drinks or equivalent per week  . Drug Use: No  . Sexual Activity: Yes   Other Topics Concern  . Not on file   Social History Narrative     All other systems reviewed and are otherwise negative except as noted above.  Physical Exam  Blood pressure 130/95,  pulse 66, temperature 97.7 F (36.5 C), temperature source Oral, resp. rate 16, height  (1.702 m), weight 211 lb (95.709 kg), last menstrual period 02/18/2016, SpO2 100 %.  General: Pleasant, NAD Psych: Normal affect. Neuro: Alert and oriented X 3. Moves all extremities spontaneously. HEENT: Normal  Neck: Supple without bruits or JVD. Lungs:  Resp regular and unlabored, CTA. Heart: RRR no s3, s4, or murmurs. Abdomen: Soft, non-tender, non-distended, BS + x 4.  Extremities: No clubbing, cyanosis or edema. DP/PT/Radials 2+ and equal bilaterally.  Labs  No results for input(s): CKTOTAL, CKMB, TROPONINI in the last 72 hours. Lab Results  Component Value Date   WBC 9.2 03/13/2016   HGB 13.7 03/13/2016   HCT 41.3 03/13/2016   MCV 92.6 03/13/2016   PLT 371 03/13/2016    Recent Labs Lab 03/13/16 1311  NA 137  K 4.5  CL 103  CO2 24  BUN 11  CREATININE 0.79  CALCIUM 9.2  GLUCOSE 84   No results found for: CHOL, HDL, LDLCALC, TRIG No results found for: DDIMER   Radiology/Studies  Ct Heart Morp W/cta Cor W/score W/ca W/cm &/or Wo/cm  03/12/2016  ADDENDUM REPORT: 03/12/2016 19:01 CLINICAL DATA:  50 year old female with equivocal nuclear stress test (scar vs artifact). EXAM: Cardiac/Coronary  CT TECHNIQUE: The patient was scanned on a Philips 256 scanner. FINDINGS: A 120 kV prospective scan was triggered in the descending thoracic aorta at 111 HU's. Axial non-contrast 3 mm slices were carried out through the heart. The data set was analyzed on a dedicated work station and scored using the Agatson method. Gantry rotation speed was 270 msecs and collimation was .9 mm. 5 mg of iv metoprolol and 0.8 mg of sl NTG was given. The 3D data set was reconstructed in 5% intervals of the 67-82 % of the R-R cycle. Diastolic phases were analyzed on a dedicated work station using MPR, MIP and VRT modes. The patient received 80 cc of contrast. Aorta:  Normal caliber.  No calcifications.  No dissection.  Aortic Valve:  Trileaflet.  No calcifications. Coronary Arteries:  Normal coronary origin.  Right dominance. RCA has a subtotal stenosis of the ostial/proximal segment with 99% stenosis. Mid/distal RCA and a large PDA/PLA arteries have no significant plaque. Left main has mild non-calcified ostial stenosis. LAD is a moderate caliber vessel that has no stenosis in the proximal and mid segment. There is a small intramyocardial bridge at the mid/distal LAD after which the lumen is very small. Three diagonal branches are small and have no obvious stenosis. LCX artery is small non-dominant and gives rise to a small OM branch, there is no significant stenosis. IMPRESSION: 1. Coronary calcium score of 0. This was 0 percentile for  age and sex matched control. 2.  Normal coronary origin with right dominance. 3. 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 Cardiac cath is recommended. 4.  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 Tobias Alexander Electronically Signed   By: Tobias Alexander   On: 03/12/2016 19:01  03/12/2016  EXAM: OVER-READ INTERPRETATION  CT CHEST The following report is an over-read performed by radiologist Dr. [facility] on 03/12/2016. This over-read does not include interpretation of cardiac or coronary anatomy or pathology. The coronary CTA interpretation by the cardiologist is attached. COMPARISON:  10/25/2011 FINDINGS: No mediastinal or hilar mass or adenopathy. The visualized esophagus is grossly normal. The aorta is normal in caliber. No atherosclerotic calcifications. The lungs are clear of acute process. No worrisome pulmonary lesions in the visualized lung fields. The 4 mm right upper lobe pulmonary nodule seen on the prior study is no longer identified. IMPRESSION: No mediastinal or hilar mass or adenopathy. No worrisome lung lesions. Electronically Signed: By: Rudie Meyer M.D. On: 03/12/2016 13:56    2D ECHO: 02/29/2016 LV EF: 50% -  55% Study Conclusions - Left ventricle: The cavity size was normal. Wall thickness was  normal. Systolic function was normal. The estimated ejection  fraction was in the range of 50% to 55%. Wall motion was normal;  there were no regional wall motion abnormalities. Doppler  parameters are consistent with abnormal left ventricular  relaxation (grade 1 diastolic dysfunction). Impressions: - Normal LV systolic function; grade 1 diastolic dysfunction; trace  MR and TR.   ECG  NSR HR 75  ASSESSMENT AND PLAN  KYNLI CHOU is a 50 y.o. female with a history of Ehlers Danlos, hypothyroidism and recent abnormal Cardiac CT-A who presents to [facility] today for outpatient cardiac cath.   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 Cardiac cath was recommended.  -- Plan for coronary angiography today with possible PCI  I have reviewed the risks, indications, and alternatives to cardiac catheterization and possible angioplasty/stenting with the patient. Risks include but are not limited to bleeding, infection, vascular injury, stroke, myocardial infection, arrhythmia, kidney injury, radiation-related injury in the case of prolonged fluoroscopy use, emergency cardiac surgery, and death. The patient understands the risks of serious complication is low (<1%).   Carylon Perches Danolos: recent 2D ECHO normal  "Irregular heartbeat": await results of outpatient cardiac monitor.   Hypothyroidism: recent TSH normal  Signed, Janetta Hora, PA-C 03/15/2016, 11:50 AM  Pager [phone]  Patient seen and examined. Agree with assessment and plan.  I reviewed the patient's outpatient evaluation, including her initial assessment by Dr. Duke Salvia, and subsequent studies including her echo, Lexiscan Myoview study  and cardiac CTA.  The patient has experienced progressive exertional chest pain and exertional shortness of breath worrisome for high-grade coronary obstructive disease as suggested by her ostial RCA abnormality noted on her CT scan.  I have discussed catheterization and potential need for percutaneous coronary intervention with the patient in detail.  Plan cardiac catheterization today.   Lennette Bihari, MD, [facility] 03/15/2016 1:22 PM

## 2016-03-16 ENCOUNTER — Emergency Department (HOSPITAL_COMMUNITY)

## 2016-03-16 ENCOUNTER — Emergency Department (HOSPITAL_COMMUNITY)
Admission: EM | Admit: 2016-03-16 | Discharge: 2016-03-16 | Disposition: A | Discharge status: Home / Self Care | Attending: Emergency Medicine | Admitting: Emergency Medicine

## 2016-03-16 ENCOUNTER — Encounter (HOSPITAL_COMMUNITY): Payer: Self-pay | Admitting: *Deleted

## 2016-03-16 DIAGNOSIS — Z8659 Personal history of other mental and behavioral disorders: Secondary | ICD-10-CM | POA: Diagnosis not present

## 2016-03-16 DIAGNOSIS — R0600 Dyspnea, unspecified: Secondary | ICD-10-CM | POA: Insufficient documentation

## 2016-03-16 DIAGNOSIS — Q796 Ehlers-Danlos syndrome: Secondary | ICD-10-CM | POA: Insufficient documentation

## 2016-03-16 DIAGNOSIS — M25512 Pain in left shoulder: Secondary | ICD-10-CM | POA: Insufficient documentation

## 2016-03-16 DIAGNOSIS — R51 Headache: Secondary | ICD-10-CM | POA: Diagnosis not present

## 2016-03-16 DIAGNOSIS — Z9889 Other specified postprocedural states: Secondary | ICD-10-CM | POA: Insufficient documentation

## 2016-03-16 DIAGNOSIS — E079 Disorder of thyroid, unspecified: Secondary | ICD-10-CM | POA: Insufficient documentation

## 2016-03-16 DIAGNOSIS — H547 Unspecified visual loss: Secondary | ICD-10-CM | POA: Diagnosis not present

## 2016-03-16 DIAGNOSIS — Z8719 Personal history of other diseases of the digestive system: Secondary | ICD-10-CM | POA: Insufficient documentation

## 2016-03-16 DIAGNOSIS — Z79899 Other long term (current) drug therapy: Secondary | ICD-10-CM | POA: Diagnosis not present

## 2016-03-16 DIAGNOSIS — Z9861 Coronary angioplasty status: Secondary | ICD-10-CM | POA: Diagnosis not present

## 2016-03-16 DIAGNOSIS — R0602 Shortness of breath: Secondary | ICD-10-CM | POA: Diagnosis present

## 2016-03-16 LAB — I-STAT TROPONIN, ED: TROPONIN I, POC: 0.04 ng/mL (ref 0.00–0.08)

## 2016-03-16 LAB — CBC
HCT: 39.8 % (ref 36.0–46.0)
Hemoglobin: 13.3 g/dL (ref 12.0–15.0)
MCH: 30.7 pg (ref 26.0–34.0)
MCHC: 33.4 g/dL (ref 30.0–36.0)
MCV: 91.9 fL (ref 78.0–100.0)
PLATELETS: 327 10*3/uL (ref 150–400)
RBC: 4.33 MIL/uL (ref 3.87–5.11)
RDW: 13.1 % (ref 11.5–15.5)
WBC: 11 10*3/uL — ABNORMAL HIGH (ref 4.0–10.5)

## 2016-03-16 LAB — BASIC METABOLIC PANEL
ANION GAP: 11 (ref 5–15)
BUN: 9 mg/dL (ref 6–20)
CALCIUM: 9.4 mg/dL (ref 8.9–10.3)
CO2: 21 mmol/L — AB (ref 22–32)
CREATININE: 0.86 mg/dL (ref 0.44–1.00)
Chloride: 106 mmol/L (ref 101–111)
GLUCOSE: 113 mg/dL — AB (ref 65–99)
Potassium: 3.8 mmol/L (ref 3.5–5.1)
Sodium: 138 mmol/L (ref 135–145)

## 2016-03-16 MED ORDER — IOPAMIDOL (ISOVUE-370) INJECTION 76%
INTRAVENOUS | Status: AC
  Administered 2016-03-16: 100 mL
  Filled 2016-03-16: qty 100

## 2016-03-16 NOTE — ED Notes (Signed)
Patient presents with numerous complaints.  Had heart cath yesterday, today c/o vision being fuzzy, felling like she is going to pass out, left shoulder pain, SOB, headache

## 2016-03-16 NOTE — ED Notes (Signed)
Spoke with Dr Estell HarpinZammit and head CT ordered

## 2016-03-16 NOTE — ED Notes (Signed)
Pt left at this time with all belongings.  

## 2016-03-16 NOTE — Telephone Encounter (Signed)
Entered in error

## 2016-03-16 NOTE — ED Notes (Signed)
Upon thei nurse arriving to the triage room after talking with the MD patient now c/o CP (centralized)

## 2016-03-16 NOTE — ED Provider Notes (Signed)
CSN: [id number]     Arrival date & time 03/16/16  1923 History   First MD Initiated Contact with Patient 03/16/16 2005     Chief Complaint  Patient presents with  . Shortness of Breath  . Loss of Vision  . Left shoulder pain      (Consider location/radiation/quality/duration/timing/severity/associated sxs/prior Treatment) Patient is a 50 y.o. female presenting with shortness of breath. The history is provided by the patient (The patient complains of shortness of breath. She recently had a cardiac cath that was negative).  Shortness of Breath Severity:  Moderate Onset quality:  Sudden Timing:  Constant Progression:  Waxing and waning Chronicity:  Recurrent Context: activity   Associated symptoms: no abdominal pain, no chest pain, no cough, no headaches and no rash     Past Medical History  Diagnosis Date  . Thyroid disease   . Depressive disorder   . Tarsal tunnel syndrome   . Abnormal vision   . Eustachian tube disorder   . Gastritis   . IBS (irritable bowel syndrome)   . Joint pain     Multiple  . Degeneration of lumbar intervertebral disc   . Intermittent torticollis   . Sciatica   . Chest pain   . Abdominal pain   . Abnormal C-reactive protein   . Ehlers-Danlos disease 02/08/2016   Past Surgical History  Procedure Laterality Date  . Cesarean section    . Cholecystectomy    . Hemorrhoid surgery    . Tonsillectomy    . Tubal ligation    . Coronary angioplasty     Family History  Problem Relation Age of Onset  . Heart disease Father   . Arrhythmia Father   . Hypertension Mother   . Aneurysm Mother     Intracranial   . CVA Maternal Grandmother    Social History  Substance Use Topics  . Smoking status: Never Smoker   . Smokeless tobacco: Never Used  . Alcohol Use: 0.0 oz/week    0 Standard drinks or equivalent per week   OB History    No data available     Review of Systems  Constitutional: Negative for appetite change and fatigue.  HENT: Negative  for congestion, ear discharge and sinus pressure.   Eyes: Negative for discharge.  Respiratory: Positive for shortness of breath. Negative for cough.   Cardiovascular: Negative for chest pain.  Gastrointestinal: Negative for abdominal pain and diarrhea.  Genitourinary: Negative for frequency and hematuria.  Musculoskeletal: Negative for back pain.  Skin: Negative for rash.  Neurological: Negative for seizures and headaches.  Psychiatric/Behavioral: Negative for hallucinations.      Allergies  Codeine and Synthroid  Home Medications   Prior to Admission medications   Medication Sig Start Date End Date Taking? Authorizing Provider  ARMOUR THYROID 90 MG tablet Take 90 mg by mouth daily.  02/23/16  Yes Historical Provider, MD  ketorolac (TORADOL) 10 MG tablet Take 10 mg by mouth every 6 (six) hours as needed for moderate pain.  02/23/16  Yes Historical Provider, MD  TIROSINT 25 MCG CAPS 25 mcg every morning. 02/26/16  Yes Historical Provider, MD  Vitamin D, Ergocalciferol, (DRISDOL) 50000 units CAPS capsule Take 10,000 Units by mouth 2 (two) times a week. Wed and Sun 03/05/16  Yes Historical Provider, MD  amLODipine (NORVASC) 2.5 MG tablet Take 1 tablet (2.5 mg total) by mouth daily. 03/15/16   Janetta Hora, PA-C   BP 131/70 mmHg  Pulse 75  Temp(Src) 97.8 F (  36.6 C) (Oral)  Resp 17  Wt 211 lb (95.709 kg)  SpO2 96%  LMP 02/18/2016 Physical Exam  Constitutional: She is oriented to person, place, and time. She appears well-developed.  HENT:  Head: Normocephalic.  Eyes: Conjunctivae and EOM are normal. No scleral icterus.  Neck: Neck supple. No thyromegaly present.  Cardiovascular: Normal rate and regular rhythm.  Exam reveals no gallop and no friction rub.   No murmur heard. Pulmonary/Chest: No stridor. She has no wheezes. She has no rales. She exhibits no tenderness.  Abdominal: She exhibits no distension. There is no tenderness. There is no rebound.  Musculoskeletal: Normal  range of motion. She exhibits no edema.  Lymphadenopathy:    She has no cervical adenopathy.  Neurological: She is oriented to person, place, and time. She exhibits normal muscle tone. Coordination normal.  Skin: No rash noted. No erythema.  Psychiatric: She has a normal mood and affect. Her behavior is normal.    ED Course  Procedures (including critical care time) Labs Review Labs Reviewed  BASIC METABOLIC PANEL - Abnormal; Notable for the following:    CO2 21 (*)    Glucose, Bld 113 (*)    All other components within normal limits  CBC - Abnormal; Notable for the following:    WBC 11.0 (*)    All other components within normal limits  I-STAT TROPOININ, ED  I-STAT BETA HCG BLOOD, ED ([facility], [facility], [facility] ONLY)    Imaging Review Dg Chest 2 View  03/16/2016  CLINICAL DATA:  Left side CP, SOB, dizziness, tunnel vision x1 day. Pt experienced similar symptoms over the past month eventually resulting in a cardiac catheterization yesterday. Pt's symptoms have returned and become worse. Nonsmoker. EXAM: CHEST  2 VIEW COMPARISON:  Chest CT, 10/25/2011 FINDINGS: The heart size and mediastinal contours are within normal limits. Both lungs are clear. No pleural effusion or pneumothorax. Bony thorax is intact. IMPRESSION: No active cardiopulmonary disease. Electronically Signed   By: Amie Portlandavid  Ormond M.D.   On: 03/16/2016 19:53   Ct Head Wo Contrast  03/16/2016  CLINICAL DATA:  Fuzzy vision.  Dizziness and headache. EXAM: CT HEAD WITHOUT CONTRAST TECHNIQUE: Contiguous axial images were obtained from the base of the skull through the vertex without intravenous contrast. COMPARISON:  None FINDINGS: No acute cortical infarct, hemorrhage, or mass lesion ispresent. Ventricles are of normal size. No significant extra-axial fluid collection is present. Partial opacification of the ethmoid air cells identified. There is mucosal thickening involving the sphenoid sinus and maxillary sinuses. The osseous skull is intact.  IMPRESSION: 1. Normal brain. 2. Sinus opacification. Electronically Signed   By: Signa Kellaylor  Stroud M.D.   On: 03/16/2016 20:22   Ct Angio Chest Pe W/cm &/or Wo Cm  03/16/2016  CLINICAL DATA:  Shortness of breath. EXAM: CT ANGIOGRAPHY CHEST WITH CONTRAST TECHNIQUE: Multidetector CT imaging of the chest was performed using the standard protocol during bolus administration of intravenous contrast. Multiplanar CT image reconstructions and MIPs were obtained to evaluate the vascular anatomy. CONTRAST:  100 cc of Isovue 370 COMPARISON:  03/12/2016 FINDINGS: Exam detail diminished due to respiratory motion artifact. Mediastinum: The heart size is normal. No pericardial effusion. The trachea appears patent and is midline. Normal appearance of the esophagus. The main pulmonary artery is patent. No saddle embolus. No lobar pulmonary artery filling defects identified. The respiratory motion artifact diminishes detail of the segmental and subsegmental branches of the pulmonary arteries. No enlarged mediastinal or hilar lymph nodes. Lungs/Pleura: There is no pleural  effusion identified. No pulmonary edema or airspace consolidation noted. In the right upper lobe there is a 6 x 4 mm nodule (5 mm mean diameter) on image 34 of series 6. Upper Abdomen: No acute findings. Musculoskeletal: Negative. Review of the MIP images confirms the above findings. IMPRESSION: 1. Exam detail diminished secondary to respiratory motion artifact. 2. No evidence for acute pulmonary embolus. 3. No follow-up needed if patient is low-risk. Non-contrast chest CT can be considered in 12 months if patient is high-risk. This recommendation follows the consensus statement: Guidelines for Management of Incidental Pulmonary Nodules Detected on CT Images:From the Fleischner Society 2017; published online before print (10.1148/radiol.1610960454). Electronically Signed   By: Signa Kell M.D.   On: 03/16/2016 22:58   I have personally reviewed and evaluated  these images and lab results as part of my medical decision-making.   EKG Interpretation None      MDM   Final diagnoses:  Dyspnea    Patient with shortness of breath. Patient had recent cardiac cath that was normal. CT angiogram chest is normal.  Labs unremarkable. Shortness of breath could be related to anxiety. Patient will be referred to pulmonary for possible point function tests   Bethann Berkshire, MD 03/16/16 2317

## 2016-03-16 NOTE — Discharge Instructions (Signed)
Follow-up with Dr. Sherene SiresWert with pulmonary or see one of his partners. Also get a family doctor

## 2016-03-18 ENCOUNTER — Encounter (HOSPITAL_COMMUNITY): Payer: Self-pay | Admitting: Cardiovascular Disease

## 2016-03-18 MED FILL — Nitroglycerin IV Soln 100 MCG/ML in D5W: INTRA_ARTERIAL | Qty: 10 | Status: AC

## 2016-03-26 ENCOUNTER — Telehealth: Payer: Self-pay | Admitting: *Deleted

## 2016-03-26 NOTE — Telephone Encounter (Signed)
Spoke with patient and she is feeling better She did stop her Tirosint and her shortness of breath is much better, still has fatigue Feels that medication changes and this were why she had issues. For this reason she did not start the Amlodipine recommended after cath Patient is wanting her monitor results Will forward to Dr Duke Salviaandolph for review

## 2016-03-29 NOTE — Telephone Encounter (Signed)
Patient doing better and wants to know if she still needs follow up appointment, does not feel need to come in Will forward to Dr Duke Salviaandolph for review   Advised patient of results  Dr Leonides Sakeandolph's comments below      Holter did not show any abnormal rhythms. At the times she noted shortness of breath and chest pain her heart rhythm was normal.

## 2016-03-29 NOTE — Telephone Encounter (Signed)
I'm glad she is feeling better.  Given that all studies show that heart heart is normal, I am happy to see her on an as-needed basis.

## 2016-03-29 NOTE — Telephone Encounter (Signed)
Patient aware.

## 2016-06-17 ENCOUNTER — Ambulatory Visit: Admitting: Internal Medicine

## 2016-10-08 ENCOUNTER — Ambulatory Visit (INDEPENDENT_AMBULATORY_CARE_PROVIDER_SITE_OTHER): Admitting: Internal Medicine

## 2016-10-08 ENCOUNTER — Encounter: Payer: Self-pay | Admitting: Internal Medicine

## 2016-10-08 VITALS — BP 124/84 | HR 71 | Wt 207.0 lb

## 2016-10-08 DIAGNOSIS — E063 Autoimmune thyroiditis: Secondary | ICD-10-CM

## 2016-10-08 DIAGNOSIS — Z23 Encounter for immunization: Secondary | ICD-10-CM | POA: Diagnosis not present

## 2016-10-08 DIAGNOSIS — E038 Other specified hypothyroidism: Secondary | ICD-10-CM

## 2016-10-08 LAB — TSH: TSH: 0.78 u[IU]/mL (ref 0.35–4.50)

## 2016-10-08 LAB — T4, FREE: Free T4: 0.78 ng/dL (ref 0.60–1.60)

## 2016-10-08 LAB — T3, FREE: T3, Free: 5.1 pg/mL — ABNORMAL HIGH (ref 2.3–4.2)

## 2016-10-08 MED ORDER — ARMOUR THYROID 90 MG PO TABS: 90.0000 mg | ORAL_TABLET | Freq: Every day | ORAL | 3 refills | Status: DC

## 2016-10-08 MED ORDER — ARMOUR THYROID 15 MG PO TABS: 15.0000 mg | ORAL_TABLET | Freq: Every day | ORAL | 3 refills | Status: DC

## 2016-10-08 NOTE — Patient Instructions (Addendum)
Please continue Armour 90+15 mg daily.  Take the thyroid hormone every day, with water, at least 30 minutes before breakfast, separated by at least 4 hours from: - acid reflux medications - calcium - iron - multivitamins  Please come back for a follow-up appointment in 6 months.

## 2016-10-08 NOTE — Progress Notes (Signed)
Patient ID: Vickie Wood, female   DOB: 11-29-1966, 50 y.o.   MRN: [id number]   HPI  Vickie Wood is a 50 y.o.-year-old female, initially referred by her PCP, Dr Lysbeth PennerHandshaw, for management of hypothyroidism. She saw endo before: Dr Robbie LouisiAlessio. Last visit 6 of ago.  She was dx'ed with heart arrhythmia. She went back to Armour 105 mg daily.  She had a low B12, C, D vitamins >> now off B12 im as she was getting.  She had a tick bite >> was on Doxycycline for 1 mo. She c/o feeling down, also mm aches, joint pain, fatigue.  Pt. has been dx with hypothyroidism in 2008 (fatigue, weight gain - 30 lbs over 3 mo, brain fog); was on Armour 120 mg >> [facility] dose (more pure) >> . She was started initially on LT4 >> itching with different formulations.  Takes Armour - now on this for 6 mo: - fasting - with water - separated by >30 min from b'fast  - no calcium, iron, multivitamins  - prn PPIs  I reviewed pt's thyroid tests: Lab Results  Component Value Date   TSH 2.86 03/13/2016   TSH 3.13 02/13/2016   TSH 0.63 12/19/2015   TSH 0.79 04/05/2015   FREET4 0.73 02/13/2016   FREET4 0.74 12/19/2015  04/05/2015: free T4  0.96 (0.82-1.77), free T3 3.8  (2-4.4) 06/24/2014: TSH 1.16, free T4 1.08 (0.82-1.77), free T3 6.1 (2-4.4)  Component     Latest Ref Rng 12/19/2015  TSH     0.35 - 4.50 uIU/mL 0.63  Free T4     0.60 - 1.60 ng/dL 4.090.74  T3, Free     2.3 - 4.2 pg/mL 4.5 (H)  Thyroperoxidase Ab SerPl-aCnc     <9 IU/mL 364 (H)   Pt describes: - fatigue - depression - constipation  Pt denies feeling nodules in neck, hoarseness, dysphagia/odynophagia, SOB with lying down.  She has + FH of thyroid disorders in: GM and 2 cousins. No FH of thyroid cancer.  No h/o radiation tx to head or neck. No recent use of iodine supplements.  She has FH of DM2: grandfather and aunt.   ROS: Constitutional: + see HPI Eyes: no blurry vision, no xerophthalmia ENT: no sore throat, no nodules  palpated in throat, no dysphagia/no odynophagia, no hoarseness Cardiovascular: no CP/SOB/palpitations/leg swelling Respiratory: no cough/SOB Gastrointestinal: no N/V/D/+ C Musculoskeletal: + both: muscle/joint aches Skin: no rashes Neurological: no tremors/numbness/tingling/dizziness, + HA  I reviewed pt's medications, allergies, PMH, social hx, family hx, and changes were documented in the history of present illness. Otherwise, unchanged from my initial visit note.  Past Medical History:  Diagnosis Date  . Abdominal pain   . Abnormal C-reactive protein   . Abnormal vision   . Chest pain   . Degeneration of lumbar intervertebral disc   . Depressive disorder   . Ehlers-Danlos disease 02/08/2016  . Eustachian tube disorder   . Gastritis   . IBS (irritable bowel syndrome)   . Intermittent torticollis   . Joint pain    Multiple  . Sciatica   . Tarsal tunnel syndrome   . Thyroid disease   Ehlers-Danlos sd.  Past Surgical History:  Procedure Laterality Date  . CARDIAC CATHETERIZATION N/A 03/15/2016   Procedure: Left Heart Cath and Coronary Angiography;  Surgeon: Lennette Biharihomas A Kelly, MD;  [facility];  Service: Cardiovascular;  Laterality: N/A;  . CESAREAN SECTION    . CHOLECYSTECTOMY    .  CORONARY ANGIOPLASTY    . HEMORRHOID SURGERY    . TONSILLECTOMY    . TUBAL LIGATION     Social History   Social History  . Marital Status: Married    Spouse Name: N/A  . 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 Smoking status: Never Smoker   . Smokeless tobacco: Not on file  . Alcohol Use: 0.0 oz/week    0 Standard drinks or equivalent per week  . Drug Use: No   Current Outpatient Prescriptions on File Prior to Visit  Medication Sig Dispense Refill  . ARMOUR THYROID 90 MG tablet Take 105 mg by mouth daily.   2  . ketorolac (TORADOL) 10 MG tablet Take 10 mg by mouth every 6 (six) hours as needed for moderate  pain.   5  . Vitamin D, Ergocalciferol, (DRISDOL) 50000 units CAPS capsule Take 10,000 Units by mouth 2 (two) times a week. Wed and Sun    . amLODipine (NORVASC) 2.5 MG tablet Take 1 tablet (2.5 mg total) by mouth daily. (Patient not taking: Reported on 10/08/2016) 90 tablet 3   No current facility-administered medications on file prior to visit.    Allergies  Allergen Reactions  . Codeine Itching    Uncontrollable  . Synthroid [Levothyroxine Sodium] Itching    Uncontrollable    Family History  Problem Relation Age of Onset  . Heart disease Father   . Arrhythmia Father   . Hypertension Mother   . Aneurysm Mother     Intracranial   . CVA Maternal Grandmother    Her daughter has a 9 year h/o Lyme ds, only recently dx'ed.   PE: BP 124/84 (BP Location: Left Arm, Patient Position: Sitting)   Pulse 71   Wt 207 lb (93.9 kg)   LMP 10/04/2016   SpO2 99%   BMI 32.42 kg/m  Wt Readings from Last 3 Encounters:  10/08/16 207 lb (93.9 kg)  03/16/16 211 lb (95.7 kg)  03/15/16 211 lb (95.7 kg)   Constitutional: overweight, in NAD Eyes: PERRLA, EOMI, no exophthalmos ENT: moist mucous membranes, no thyromegaly, but L thyroid fulness, no cervical lymphadenopathy Cardiovascular: RRR, No MRG Respiratory: CTA B Gastrointestinal: abdomen soft, NT, ND, BS+ Musculoskeletal: no deformities, strength intact in all 4 Skin: moist, warm, no rashes Neurological: no tremor with outstretched hands, DTR normal in all 4  ASSESSMENT: 1. Hypothyroidism  2. L Thyroid fulness 12/21/2015: Thyroid U/S:  FINDINGS: There is moderate diffuse heterogeneity of thyroid parenchymal echotexture.  Right thyroid lobe Measurements: Normal in size measuring 3.8 x 1.6 x 1.3 cm. Right, inferior, posterior- 0.4 x 0.8 x 0.5 cm - hypoechoic, likely cystic  Left thyroid lobe Measurements: Normal in size measuring 4.3 x 1.2 x 1.3 cm. Left, inferior, exophytic - 0.6 x 0.6 x 0.4 cm - anechoic, likely cystic - it is  unclear if this nodule is arising exophytically from the inferior aspect of the left lobe of the thyroid versus an adjacent discrete nodule / lymph node.  Isthmus Thickness: Normal in size measuring 0.3 cm in diameter No discrete nodule or mass identified within the thyroid isthmus.  Lymphadenopathy: None visualized.  IMPRESSION: Findings suggestive of multi nodular goiter. None of the discretely measured thyroid nodules currently meet imaging criteria to recommend percutaneous sampling.  PLAN:  1. Patient with long-standing hypothyroidism, now back on Armour therapy. [facility] not feel good on this >>  restarted Armour, but on a lower dose (105 mg). She appears euthyroid, but still feels fatigued.  - We discussed about correct intake of thyroid hh, fasting, with water, separated by at least 30 minutes from breakfast, and separated by more than 4 hours from calcium, iron, multivitamins, acid reflux medications (PPIs). She is taking it correctly. - will check thyroid tests today: TSH, free T4, free T3 and adjust the Armour dose as needed - If these are abnormal, she will need to return in 6-8 weeks for repeat labs - If these are normal, I will see her back in 6 months  2. 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  Component     Latest Ref Rng & Units 10/08/2016  TSH     0.35 - 4.50 uIU/mL 0.78  T4,Free(Direct)     0.60 - 1.60 ng/dL 1.61  Triiodothyronine,Free,Serum     2.3 - 4.2 pg/mL 5.1 (H)   TSH and free T4 are normal. Free T3 slightly high, which is likely due to dosing of Armour before checking labs, that this was a morning appointment.  Carlus Pavlov, MD PhD [facility]

## 2017-01-13 ENCOUNTER — Telehealth: Payer: Self-pay

## 2017-01-13 ENCOUNTER — Other Ambulatory Visit: Payer: Self-pay

## 2017-01-13 ENCOUNTER — Telehealth: Payer: Self-pay | Admitting: Internal Medicine

## 2017-01-13 DIAGNOSIS — E039 Hypothyroidism, unspecified: Secondary | ICD-10-CM

## 2017-01-13 NOTE — Telephone Encounter (Signed)
Patient is returning your call, phone number has changed

## 2017-01-13 NOTE — Telephone Encounter (Signed)
Called patient and advised of lab appointment, made appointment for tomorrow. Lab orders are in. No questions at this time.

## 2017-01-13 NOTE — Telephone Encounter (Signed)
Patient stated she is having a lot of tiredness and and itching. Please advise

## 2017-01-13 NOTE — Telephone Encounter (Signed)
Attempted to contact patient to notify her of lab work and appointment that needs to be made. Line was busy, will try again later. Orders are placed.

## 2017-01-13 NOTE — Telephone Encounter (Signed)
Let's have her back for a TSH, fT4 and fT3.

## 2017-01-13 NOTE — Telephone Encounter (Signed)
Attempted to contact patient to notify her of lab work and appointment that needs to be made. Line was busy, will try again later. Orders are placed.   

## 2017-01-14 ENCOUNTER — Other Ambulatory Visit (INDEPENDENT_AMBULATORY_CARE_PROVIDER_SITE_OTHER)

## 2017-01-14 DIAGNOSIS — E039 Hypothyroidism, unspecified: Secondary | ICD-10-CM

## 2017-01-14 LAB — T3, FREE: T3 FREE: 3.6 pg/mL (ref 2.3–4.2)

## 2017-01-14 LAB — TSH: TSH: 0.58 u[IU]/mL (ref 0.35–4.50)

## 2017-01-14 LAB — T4, FREE: FREE T4: 0.66 ng/dL (ref 0.60–1.60)

## 2017-01-17 ENCOUNTER — Telehealth: Payer: Self-pay | Admitting: Internal Medicine

## 2017-01-17 ENCOUNTER — Telehealth: Payer: Self-pay

## 2017-01-17 NOTE — Telephone Encounter (Signed)
Patient received lab results, no questions at this time.  

## 2017-01-17 NOTE — Telephone Encounter (Signed)
Pt called in inquiring about her recent lab results, she requests a call back.

## 2017-01-17 NOTE — Telephone Encounter (Signed)
Patient received lab results, no questions at this time.

## 2017-03-31 ENCOUNTER — Ambulatory Visit (INDEPENDENT_AMBULATORY_CARE_PROVIDER_SITE_OTHER): Admitting: Internal Medicine

## 2017-03-31 ENCOUNTER — Encounter: Payer: Self-pay | Admitting: Internal Medicine

## 2017-03-31 VITALS — BP 110/72 | HR 70 | Wt 212.0 lb

## 2017-03-31 DIAGNOSIS — E063 Autoimmune thyroiditis: Secondary | ICD-10-CM | POA: Diagnosis not present

## 2017-03-31 DIAGNOSIS — Z833 Family history of diabetes mellitus: Secondary | ICD-10-CM

## 2017-03-31 DIAGNOSIS — E042 Nontoxic multinodular goiter: Secondary | ICD-10-CM | POA: Diagnosis not present

## 2017-03-31 DIAGNOSIS — E038 Other specified hypothyroidism: Secondary | ICD-10-CM | POA: Diagnosis not present

## 2017-03-31 LAB — HEMOGLOBIN A1C: Hgb A1c MFr Bld: 5.4 % (ref 4.6–6.5)

## 2017-03-31 LAB — T4, FREE: Free T4: 0.59 ng/dL — ABNORMAL LOW (ref 0.60–1.60)

## 2017-03-31 LAB — T3, FREE: T3 FREE: 3 pg/mL (ref 2.3–4.2)

## 2017-03-31 LAB — TSH: TSH: 4.82 u[IU]/mL — AB (ref 0.35–4.50)

## 2017-03-31 NOTE — Progress Notes (Signed)
Patient ID: Vickie Wood, female   DOB: 1966-11-12, 51 y.o.   MRN: [id number]   HPI  Vickie Wood is a 51 y.o.-year-old female, initially referred by her PCP, Dr Lysbeth Penner, for management of hypothyroidism. She saw endo before: Dr Robbie Louis. Last visit 6 mo ago.  Reviewed hx: Pt. has been dx with hypothyroidism in 2008 (fatigue, weight gain - 30 lbs over 3 mo, brain fog); was on Armour 120 mg >> then [facility] thyroid same dose (more pure) >> . She was started initially on LT4 >> itching with different formulations.  She is on Armour 90 mg daily - eliminated 15 mg 6 weeks ago b/c itching >> feels better. - fasting - with water - separated by >30 min from b'fast  - no calcium, iron, multivitamins  - prn PPIs - takes vitamin D 5000 units and vitamin K daily.  I reviewed pt's thyroid tests: Lab Results  Component Value Date   TSH 0.58 01/14/2017   TSH 0.78 10/08/2016   TSH 2.86 03/13/2016   TSH 3.13 02/13/2016   TSH 0.63 12/19/2015   TSH 0.79 04/05/2015   FREET4 0.66 01/14/2017   FREET4 0.78 10/08/2016   FREET4 0.73 02/13/2016   FREET4 0.74 12/19/2015   T3FREE 3.6 01/14/2017   T3FREE 5.1 (H) 10/08/2016   T3FREE 4.8 (H) 02/13/2016   T3FREE 4.5 (H) 12/19/2015   04/05/2015: free T4  0.96 (0.82-1.77), free T3 3.8  (2-4.4) 06/24/2014: TSH 1.16, free T4 1.08 (0.82-1.77), free T3 6.1 (2-4.4)  Component     Latest Ref Rng 12/19/2015  Thyroperoxidase Ab SerPl-aCnc     <9 IU/mL 364 (H)   Pt describes: - fatigue - depression - constipation  Pt denies feeling nodules in neck, hoarseness, dysphagia/odynophagia, SOB with lying down.  She has + FH of thyroid disorders in: GM and 2 cousins. No FH of thyroid cancer.  No h/o radiation tx to head or neck. No recent use of iodine supplements.  She had a low B12, C, D vitamins >> now off B12 im.  ROS: Constitutional: no weight gain/loss, no fatigue, no subjective hyperthermia/hypothermia Eyes: no blurry vision, no xerophthalmia ENT: no sore  throat, no nodules palpated in throat, no dysphagia/odynophagia, no hoarseness Cardiovascular: no CP/SOB/palpitations/leg swelling Respiratory: no cough/SOB Gastrointestinal: no N/V/D/C Musculoskeletal: no muscle/joint aches Skin: no rashes Neurological: no tremors/numbness/tingling/dizziness  I reviewed pt's medications, allergies, PMH, social hx, family hx, and changes were documented in the history of present illness. Otherwise, unchanged from my initial visit note.  Past Medical History:  Diagnosis Date  . Abdominal pain   . Abnormal C-reactive protein   . Abnormal vision   . Chest pain   . Degeneration of lumbar intervertebral disc   . Depressive disorder   . Ehlers-Danlos disease 02/08/2016  . Eustachian tube disorder   . Gastritis   . IBS (irritable bowel syndrome)   . Intermittent torticollis   . Joint pain    Multiple  . Sciatica   . Tarsal tunnel syndrome   . Thyroid disease   Ehlers-Danlos sd.  Past Surgical History:  Procedure Laterality Date  . CARDIAC CATHETERIZATION N/A 03/15/2016   Procedure: Left Heart Cath and Coronary Angiography;  Surgeon: Lennette Bihari, MD;  Location: [facility] INVASIVE CV LAB;  Service: Cardiovascular;  Laterality: N/A;  . CESAREAN SECTION    . CHOLECYSTECTOMY    . CORONARY ANGIOPLASTY    . HEMORRHOID SURGERY    . TONSILLECTOMY    . TUBAL LIGATION  Social History   Social History  . Marital Status: Married    Spouse Name: N/A  . 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 Smoking status: Never Smoker   . Smokeless tobacco: Not on file  . Alcohol Use: 0.0 oz/week    0 Standard drinks or equivalent per week  . Drug Use: No   Current Outpatient Prescriptions on File Prior to Visit  Medication Sig Dispense Refill  . ARMOUR THYROID 90 MG tablet Take 1 tablet (90 mg total) by mouth daily. Take along with the 15 mg tablet for a total of 105 mg daily. 90 tablet 3  . ketorolac  (TORADOL) 10 MG tablet Take 10 mg by mouth every 6 (six) hours as needed for moderate pain.   5  . ARMOUR THYROID 15 MG tablet Take 1 tablet (15 mg total) by mouth daily. Take along with the 90 mg tablet for a total of 105 mg daily. (Patient not taking: Reported on 03/31/2017) 90 tablet 3  . Vitamin D, Ergocalciferol, (DRISDOL) 50000 units CAPS capsule Take 10,000 Units by mouth 2 (two) times a week. Wed and Sun     No current facility-administered medications on file prior to visit.    Allergies  Allergen Reactions  . Codeine Itching    Uncontrollable  . Synthroid [Levothyroxine Sodium] Itching    Uncontrollable    Family History  Problem Relation Age of Onset  . Heart disease Father   . Arrhythmia Father   . Hypertension Mother   . Aneurysm Mother     Intracranial   . CVA Maternal Grandmother    Her daughter has  Lyme ds.  PE: BP 110/72 (BP Location: Left Arm, Patient Position: Sitting)   Pulse 70   Wt 212 lb (96.2 kg)   LMP 03/07/2017   BMI 33.20 kg/m  Wt Readings from Last 3 Encounters:  03/31/17 212 lb (96.2 kg)  10/08/16 207 lb (93.9 kg)  03/16/16 211 lb (95.7 kg)   Constitutional: overweight, in NAD Eyes: PERRLA, EOMI, no exophthalmos ENT: moist mucous membranes, no thyromegaly, but L thyroid fulness, no cervical lymphadenopathy Cardiovascular: RRR, No MRG Respiratory: CTA B Gastrointestinal: abdomen soft, NT, ND, BS+ Musculoskeletal: no deformities, strength intact in all 4 Skin: moist, warm, no rashes Neurological: no tremor with outstretched hands, DTR normal in all 4  ASSESSMENT: 1. Hypothyroidism  2. L Thyroid fulness 12/21/2015: Thyroid U/S:  FINDINGS: There is moderate diffuse heterogeneity of thyroid parenchymal echotexture.  Right thyroid lobe Measurements: Normal in size measuring 3.8 x 1.6 x 1.3 cm. Right, inferior, posterior- 0.4 x 0.8 x 0.5 cm - hypoechoic, likely cystic  Left thyroid lobe Measurements: Normal in size measuring 4.3 x 1.2  x 1.3 cm. Left, inferior, exophytic - 0.6 x 0.6 x 0.4 cm - anechoic, likely cystic - it is unclear if this nodule is arising exophytically from the inferior aspect of the left lobe of the thyroid versus an adjacent discrete nodule / lymph node.  Isthmus Thickness: Normal in size measuring 0.3 cm in diameter No discrete nodule or mass identified within the thyroid isthmus.  Lymphadenopathy: None visualized.  IMPRESSION: Findings suggestive of multi nodular goiter. None of the discretely measured thyroid nodules currently meet imaging criteria to recommend percutaneous sampling.  PLAN:  1. Patient with long-standing hypothyroidism, now back on Armour therapy. We tried [facility] thyroid but she did not feel good on this >> restarted Armour, but  on a lower dose (105 mg = 90 + 15 mg). However, since last visit, she developed itching >> she eliminated the 15 mg tab and her itching got better. She is OTW not feeling the decrease in dose. She still has some fatigue and id frustrated she cannot lose weight. - We discussed about correct intake of thyroid hh: Take the thyroid hormone every day, with water, at least 30 minutes before breakfast, separated by at least 4 hours from: - acid reflux medications - calcium - iron - multivitamins She is taking it correctly. - will check TFTs today and adjust the Armour dose as needed. However, she would like to stay on the same dose if possible. - If these are abnormal, she will need to return in 6-8 weeks for repeat labs She is moving to [place] >> will not return for a f/u appt  2. 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  Office Visit on 03/31/2017  Component Date Value Ref Range Status  . Free T4 03/31/2017 0.59* 0.60 - 1.60 ng/dL Final   Comment: Specimens from patients who are undergoing biotin  therapy and /or ingesting biotin supplements may contain high levels of biotin.  The higher biotin concentration in these specimens interferes with this Free T4 assay.  Specimens that contain high levels  of biotin may cause false high results for this Free T4 assay.  Please interpret results in light of the total clinical presentation of the patient.    . T3, Free 03/31/2017 3.0  2.3 - 4.2 pg/mL Final  . TSH 03/31/2017 4.82* 0.35 - 4.50 uIU/mL Final  . Hgb A1c MFr Bld 03/31/2017 5.4  4.6 - 6.5 % Final   TSH slightly high free T4 very slightly low on the current dose of Armour 90 mg daily. I discussed with the patient and she would like to stay on the same dose due to previous side effects from increasing the dose. I think this is reasonable.  Carlus Pavlov, MD PhD [facility]ocrin[facility]

## 2017-03-31 NOTE — Patient Instructions (Addendum)
Please continue: - Armour 90 mg daily.  Take the thyroid hormone every day, with water, at least 30 minutes before breakfast, separated by at least 4 hours from: - acid reflux medications - calcium - iron - multivitamins  Please come back for a follow-up appointment in 1 year. 

## 2017-04-02 ENCOUNTER — Encounter: Payer: Self-pay | Admitting: Internal Medicine

## 2017-04-02 MED ORDER — ARMOUR THYROID 90 MG PO TABS: 90.0000 mg | ORAL_TABLET | Freq: Every day | ORAL | 1 refills | Status: AC

## 2017-04-02 MED ORDER — ARMOUR THYROID 90 MG PO TABS: 90.0000 mg | ORAL_TABLET | Freq: Every day | ORAL | 3 refills | Status: DC

## 2017-05-08 ENCOUNTER — Encounter: Payer: Self-pay | Admitting: Internal Medicine

## 2017-05-09 ENCOUNTER — Telehealth: Payer: Self-pay | Admitting: Internal Medicine

## 2017-05-09 NOTE — Telephone Encounter (Signed)
[facility] dismissed [facility] by Belva Cromeristina Gherge MD , effective May 08, 2017. Dismissal letter sent out by certified / registered mail. daj

## 2017-05-14 NOTE — Telephone Encounter (Signed)
Received signed domestic return receipt verifying delivery of certified letter on May 13, 2017. Article number [id number] daj

## 2021-11-05 ENCOUNTER — Institutional Professional Consult (permissible substitution): Admit: 2021-11-05 | Discharge: 2021-11-05 | Payer: BLUE CROSS/BLUE SHIELD | Attending: Obstetrics & Gynecology

## 2021-11-05 DIAGNOSIS — N8111 Cystocele, midline: Secondary | ICD-10-CM

## 2021-11-05 NOTE — Assessment & Plan Note (Signed)
Recommend PFPT and timed voids.   Voiding diary

## 2021-11-05 NOTE — Assessment & Plan Note (Signed)
We discussed Sandra Welch history and physical exam findings.  The diagnosis of pelvic organ prolapse and potential etiologies was also reviewed. Taken into consideration her clinical history and examination findings recommendation was given for patient to institute appropriate limited to lifestyle behavior modifications that limit pressure in the pelvis such as avoidance of chronic Valsalva, constipation, heavy lifting, and maintenance of weight in the optimal BMI range.  Also discussed avoidance of tobacco use and other habits that may impact collagen and connective tissue.  Additional treatment for pelvic organ prolapse to include clinical observation, pelvic floor physical therapy, use of pessary, and potential surgical options was reviewed.  After counseling she elected to proceed with PFPT and behavioral modification specifically improved bowel habits

## 2021-11-05 NOTE — Assessment & Plan Note (Signed)
Referral for Pelvic Floor Physical Therapy

## 2021-11-05 NOTE — Progress Notes (Signed)
Sandra Welch (DOB:  [birth year]) is a 55 y.o. female, is a new patient who presents for evaluation for evaluation of the following chief complaint(s):  No chief complaint on file.      PCP: No primary care provider on file.   Referring provider: No referring provider defined for this encounter.        ASSESSMENT/PLAN:  1. Cystocele, midline  Assessment & Plan:  We discussed Shenee history and physical exam findings.  The diagnosis of pelvic organ prolapse and potential etiologies was also reviewed. Taken into consideration her clinical history and examination findings recommendation was given for patient to institute appropriate limited to lifestyle behavior modifications that limit pressure in the pelvis such as avoidance of chronic Valsalva, constipation, heavy lifting, and maintenance of weight in the optimal BMI range.  Also discussed avoidance of tobacco use and other habits that may impact collagen and connective tissue.  Additional treatment for pelvic organ prolapse to include clinical observation, pelvic floor physical therapy, use of pessary, and potential surgical options was reviewed.  After counseling she elected to proceed with PFPT and behavioral modification specifically improved bowel habits    2. Pelvic floor weakness  Assessment & Plan:  Referral for Pelvic Floor Physical Therapy  3. Urinary incontinence in female  Assessment & Plan:  Recommend PFPT and timed voids.   Voiding diary    Return in about 3 months (around 02/05/2022) for Prolapse and incontinence.       Subjective   SUBJECTIVE/OBJECTIVE:    HPI  Sandra Welch presents for evaluation of vaginal prolapse.  She reports onset of her symptoms approximately 8 months ago with pressure and bulge symptoms. She also noted urinary dribbling throughout the day during this same interval. Denies loss of urine with exertion or urge. Her urine force of stream has also been interrupted and slowed at times.  Reports she has to strain on occasion to initiate voiding.   She also experiences constipation managed with diet supplements and stool softeners. She describes stool as firm and infrequent occurring every few days.  She reports that her symptoms of prolapse, incontinence and constipation significantly impacts her quality of life and physical activities.  Her goals at this time include gain overall improvement and prevent progression of her incontinence and prolapse .  The following is a summary of her urogynecologic symptoms assessment and examination.   Prolapse/Bulge Symptoms:yes     Previous treatment for prolapse symptoms: no    Urinary Symptoms:    Stress incontinence: no    Urgency: no  Urge incontinence: no  Frequency:  3-4 x day, going every 2-3  hours during the day     Nocturia (>2 voids per night): no  Enuresis: no    Pad/Liner/Brief use:2    Recurrent UTI (>2 UTI / 6 months or > 3 UTI / 1 year)? no  Pain with urination: no  Gross hematuria: no    History of kidney stones: no  Post void dribbling: no    Previous treatment for urinary symptoms: Underwent PFPT in 2013 for urinary symptoms  Additional information about urinary symptoms:     Bowel Symptoms:    Patient has a bowel movement every 2-3 days.    Constipation: yes -   Diarrhea: no    Accidental bowel leakage: No    Splinting: yes -intravaginal    Previous treatment for bowel symptoms: yes - bowel regimen     Additional information about bowel symptoms: Colonoscopy with negative findings  Sexual History:    Sexual Activity: yes -     Dyspareunia: yes - due to dryness; uses K-Y jelly    Do you avoid sexual intercourse because of b the suspected recurrence patient to contact ulging in the vagina: no    Do you leak urine or stool during sexual activity?no    Does fear of leakage (either stool or urine) restrict your sexual activity?no    Obstetrics/Gynecologic History:  G1P1    History of vaginal delivery? No  Cesarean    LMP: menopausal   Menopause: yes - If Yes, HRT:yes - Progesterone  History of  postmenopausal bleeding no. (pelvic ultrasound, endometrial biopsy results, date)    Last pap smear: 10/29/2021    History of abnormal PAP smears? no   Colonoscopy: yes -    Past Medical History:  EDS  Hypothyroid disease    Surgical History:  Tonsillectomy  Cholecystectomy  Hemorrhoidectomy    Family History of:  EDS  Ovarian Ca Maternal Grandmother  Breast Ca Maternal Aunt    Current Outpatient Medications on file  Current Outpatient Medications   Medication Sig Dispense Refill    progesterone (PROMETRIUM) 100 MG CAPS capsule   0 Refill(s)      ergocalciferol (ERGOCALCIFEROL) 1.25 MG (50000 UT) capsule ergocalciferol (vitamin D2) 1,250 mcg (50,000 unit) capsule   TAKE 1 CAPSULE BY MOUTH TWICE A WEEK      VITAMIN D-VITAMIN K PO Take by mouth      Liothyronine POWD TAKE ONE CAPSULE BY MOUTH EVERY DAY      ibuprofen (ADVIL;MOTRIN) 800 MG tablet ibuprofen 800 mg tablet   Take 1 tablet twice a day by oral route as needed for 90 days.      naproxen (NAPROSYN) 500 MG tablet       ARMOUR THYROID 120 MG tablet TAKE 1 TABLET BY MOUTH EVERY DAY      thyroid (WP THYROID) 130 MG tablet WP Thyroid 130 mg tablet   Take 1 tablet every day by oral route for 30 days.      Naltrexone 200-6.5 MG IMPL TAKE ONE CAPSULE BY MOUTH EVERY DAY       No current facility-administered medications for this visit.        Allergies:  Allergies   Allergen Reactions    Codeine Hives and Itching    Thyroid Hormones Hives and Itching        Social History:  Social History     Socioeconomic History    Marital status: Married     Spouse name: Not on file    Number of children: Not on file    Years of education: Not on file    Highest education level: Not on file   Occupational History    Not on file   Tobacco Use    Smoking status: Not on file    Smokeless tobacco: Not on file   Substance and Sexual Activity    Alcohol use: Not on file    Drug use: Not on file    Sexual activity: Not on file   Other Topics Concern    Not on file   Social History  Narrative    Not on file     Social Determinants of Health     Financial Resource Strain: Not on file   Food Insecurity: Not on file   Transportation Needs: Not on file   Physical Activity: Not on file   Stress: Not on file   Social  Connections: Not on file   Intimate Partner Violence: Not on file   Housing Stability: Not on file        Review of Systems   Genitourinary:  Positive for difficulty urinating, frequency and urgency. Negative for decreased urine volume.        Prolapse   All other systems reviewed and are negative.       Objective     PHYSICAL EXAM  There were no vitals taken for this visit.     General: No acute distress, pleasant affect  Head/Ears/Nose/Throat: Normocephalic, atraumatic  Skin: No obvious lesions, rashes, or bruises  Lungs: non-labored respirations  Cardiovascular: Warm and well perfused  Neurological: alert and responsive, able to ambulate and follow commands   Abdomen: soft, non-tender, no masses, normal bowel sounds     Pelvic Examination:  Vulva: Normal vulva: no lesions, masses, epithelial changes, or exudate Sensation: within normal limits  Vagina: Normal without lesion    Cervix: Normal; no lesion; dark post menstrual blood  Urethra: {Hypermobility    Procedures/Test:  Simple Cystometry: no    PVR 100 ml    Cough stress test: Negative    Bimanual:anteroflexed uterus    Pelvic floor muscle contraction strength (Kegel): 2     Rectal Examination:    Rectal: not done    PB intact yes - no lesions    Myofascial Pelvic Pain Assessment:    Levator ani: No    Obturator internus: No    Periurethral pain: No    POP-Q Examination:  Aa -1 Ba -1 C -9   GH  4 PB   4 TVL 10   Ap-3 Bp -3 D -11       Patient gave verbal consent and chaperone present for physical exam.         On this date 11/05/2021 I have spent 45 minutes reviewing previous notes, test results and face to face with the patient discussing the diagnosis and importance of compliance with the treatment plan as well as documenting on  the day of the visit.      An electronic signature was used to authenticate this note.    --Shelda Altes, MD

## 2022-02-05 ENCOUNTER — Encounter: Admit: 2022-02-05 | Discharge: 2022-02-05 | Payer: BLUE CROSS/BLUE SHIELD | Attending: Obstetrics & Gynecology

## 2022-02-05 DIAGNOSIS — N939 Abnormal uterine and vaginal bleeding, unspecified: Secondary | ICD-10-CM

## 2022-02-05 NOTE — Progress Notes (Signed)
Sandra Welch (DOB:  02-16-1966) is a 56 y.o. female,Established patient, here for evaluation of the following chief complaint(s):  Other (Went to Pelvic floor pt it helped mennorhgia  pt discontnued her progesterone per her primary doctor.)      PCP: No primary care provider on file.   Referring provider: No referring provider defined for this encounter.   Patient identification confirmed consent obtained for telemetry medicine visit  ASSESSMENT/PLAN:  1. Abnormal uterine bleeding (AUB)  Assessment & Plan:  She has had normal ultrasound in last year as well as normal endometrial biopsy.   She is desiring definitive care at this time and in consultation with her primary care physician she would like to proceed with hysterectomy concomitantly with repair of her anterior compartment prolapse.  We will proceed with scheduling for total vaginal hysterectomy, salpingectomy and anterior repair  Request copy of her endometrial biopsy and ultrasound to be reviewed at preop consultation  2. Cystocele, midline  Assessment & Plan:  Plan proceed with anterior repair concomitantly with hysterectomy  Return Awaiting surgical posting, for Surgical preop visit.    SUBJECTIVE/OBJECTIVE:  Sandra Welch presents for a follow up visit for OAB and prolapse.  This is a telemedicine visit.  Her current treatment plan includes:  Her last visit was on 11/05/2021. Interim since her last visit she was to be scheduled for PFPT to manage her OAB symptoms. She reports they focused on PFMR as well as a bowel regimen to include Mg supplementation. Overall she reports that her OAB is improved but incontinence persist throughout the day. Her bowels are much better with diet and magnesium supplementation.  Another concern is her menstrual periods and irregularity. She has menses every other week despite trials with Progesterone therapy.  She has had normal ultrasound and endometrial biopsy.  At the consultation she is desiring definitive care with  hysterectomy and salpingectomy with preservation of ovaries.  Discussed convalescence and goals of the surgery.  She does have symptomatic prolapse would recommend anterior colporrhaphy.  Logistically I recommend she send me copy of her in vitro biopsy and ultrasound report so that we are not duplicating studies.  Like to discuss further preop visit.  Also discussed need for preadmission testing due to patient's medical history.  Review of Systems  Genitourinary:  Positive for difficulty urinating, frequency and urgency. Negative for decreased urine volume.        Prolapse   All other systems reviewed and are negative.  There were no vitals taken for this visit.   Physical Exam  Telemedicine visit     On this date 02/05/2022 I have spent 20 minutes reviewing previous notes, test results and face to face with the patient discussing the diagnosis and importance of compliance with the treatment plan as well as documenting on the day of the visit.      An electronic signature was used to authenticate this note.    --Shelda Altes, MD

## 2022-02-05 NOTE — Assessment & Plan Note (Signed)
Plan proceed with anterior repair concomitantly with hysterectomy

## 2022-02-05 NOTE — Assessment & Plan Note (Signed)
She has had normal ultrasound in last year as well as normal endometrial biopsy.   She is desiring definitive care at this time and in consultation with her primary care physician she would like to proceed with hysterectomy concomitantly with repair of her anterior compartment prolapse.  We will proceed with scheduling for total vaginal hysterectomy, salpingectomy and anterior repair  Request copy of her endometrial biopsy and ultrasound to be reviewed at preop consultation

## 2022-02-08 NOTE — Progress Notes (Signed)
Pre Procedure Patient Instructions    Procedure Location hospital:[facility]: [address] [place] - Drop off at Outpatient Services to the left of [facility] entrance and check in at the Surgery Reception desk to your immediate left.    Procedure Date 3/14  Arrival Time Per Physicians Instructions 1100    Medications:  Medication to be taken the morning of surgery with a few sips of water only: THYROID MED  Hold or reduce the dosage of the following medication, as discussed: ALL NSAIDS AND VITAMINS ON 02/12/22  Do not take diet medications for 4 days prior to your procedure  Stop all supplements, vitamins and herbal remedies one week prior to your procedure, unless your doctor told you to continue taking.  Do not take over the counter pain medications except plain Tylenol or acetaminophen unless your doctor told you to do so.  If you are taking blood thinners, call the doctor performing your procedure and prescribing physician for instructions on when to stop.    Procedure Preparation    Diet Restrictions:No food or drink including gum or mints -after midnight    Skin Preparation:   Wash with Hibiclens or an antibacterial soap (e.g. Dial soap) the night before and morning of procedure.  Using a clean washcloth, wash from neck to toes for 3 minutes.Gently clean the area where your surgery will be done thoroughly  Do not use Hibiclens on or near your face, eyes, ears, or head  Do not put on any deodorants, lotions, powders, or oils afterwards. Be sure to put on clean clothing.    Other Preparation:  Call your surgeon right away if you get any wounds, cuts, scrapes, scabs, rashes, bug bites at or near your operative site.  Bowel prep as instructed by your doctor  Fleets enema as instructed by your doctor  Email a copy of your test results to [email or web address].      Day of Procedure Patient Instruction:  Do not smoke, vape, chew tobacco, drink alcohol or use recreational drugs  on the day of your procedure  Remove all jewelry, piercings and metal accessories  Do not wear contacts, tampons, make-up, lotions, creams, powders, fragrances or deodorant  Do not bring valuables or money  Bring a picture ID and insurance card and any of the following that are applicable to you:    ?? Storage case for eyeglasses, hearing aids, dentures, etc    ?? A loose button-up shirt if instructed    ?? Copy of your Living Will and/or Medical Durable Power of Attorney    ?? List of current medications including name and dosage    .  If you are going home the same day as your procedure, a support person should accompany you to the facility and must transport you home.  If you plan to take public transportation of any sort, your support person must accompany you home.  You will need someone to stay with you for 24 hours after your procedure with sedation of any kind.         COVID Testing Instructions:  No COVID testing required as discussed.       Comments:     The information and visitor policy was reviewed with you during your Pre-Admission Testing interview and you verbalized understanding. If you have any additional questions please contact [phone]    For financial questions regarding your procedure at a [facility] facility, please contact [phone], option 1  For financial questions regarding anesthesia at a [facility] facility, please  contact [phone]

## 2022-02-12 ENCOUNTER — Encounter: Admit: 2022-02-12 | Payer: BLUE CROSS/BLUE SHIELD | Attending: Obstetrics & Gynecology

## 2022-02-12 DIAGNOSIS — N939 Abnormal uterine and vaginal bleeding, unspecified: Secondary | ICD-10-CM

## 2022-02-12 MED ORDER — IBUPROFEN 600 MG PO TABS
600 MG | ORAL_TABLET | Freq: Three times a day (TID) | ORAL | 0 refills | Status: AC | PRN
Start: 2022-02-12 — End: ?

## 2022-02-12 NOTE — Assessment & Plan Note (Addendum)
Goals of surgery and prolapse repair reviewed.   Plan to proceed with Anterior repair - Native tissue

## 2022-02-12 NOTE — Progress Notes (Signed)
Sandra Welch (DOB:  1966/03/06) is a 56 y.o. female. Established patient here for evaluation of the following chief complaint(s):  Pre-op Exam (Virtual Visit Pre Op )      PCP: No primary care provider on file.   Referring provider: No referring provider defined for this encounter.     ASSESSMENT/PLAN:  1. Abnormal uterine bleeding (AUB)  Assessment & Plan:  Plan to proceed with scheduled TVH and Salpingectomy  Postoperative restrictions and preacautions     2. Cystocele, midline  Assessment & Plan:  Goals of surgery and prolapse repair reviewed.   Plan to proceed with Anterior repair - Native tissue    Return in about 4 weeks (around 03/12/2022) for Surgical postop visit.    SUBJECTIVE/OBJECTIVE:  Patient defecation and consent obtained to proceed with telemedicine virtual visit    HPI  Sandra Welch presents for a follow up visit for pelvic organ prolapse and history of abnormal uterine bleeding.  She is now scheduled on 02/19/2022 for total vaginal hysterectomy, salpingectomy and anterior repair.  Reviewed her history and goals for treatment.  Surgical and nonsurgical options were reviewed.  The procedure was outlined as well as anticipated perioperative course.  Surgical risk to include but not limited to intraoperative events related to bowel and bladder injury, bleeding and/or anesthesia events.  Postoperative risk of pelvic infection, voiding dysfunction, UTI and need for rehospitalization consultation were discussed.  She reports understanding.  She does express desire to be discharged same day.  Discussed staying at a local hotel following surgery and departing for home on postop day 1 due to the length of drive between her home and the hospital location.  She acknowledged understanding.  Also discussed potential need for hospitalization if unexpected perioperative event worse.      Review of Systems   Genitourinary:         Irregular menses  Prolapse   All other systems reviewed and are negative.        Physical  Exam  Deferred, telemedicine visit  On this date 02/12/2022 I have spent 15 minutes minutes reviewing previous notes, test results and face to face with the patient discussing the diagnosis and importance of compliance with the treatment plan as well as documenting on the day of the visit.      An electronic signature was used to authenticate this note.    --Shelda Altes, MD

## 2022-02-12 NOTE — Assessment & Plan Note (Addendum)
Plan to proceed with scheduled TVH and Salpingectomy  Postoperative restrictions and preacautions

## 2022-02-18 NOTE — Telephone Encounter (Signed)
Please follow up with patient if records were received.

## 2022-02-19 ENCOUNTER — Encounter

## 2022-02-19 ENCOUNTER — Inpatient Hospital Stay
Admit: 2022-02-19 | Discharge: 2022-02-20 | Disposition: A | Discharge status: Home / Self Care | Payer: BLUE CROSS/BLUE SHIELD | Attending: Obstetrics & Gynecology | Admitting: Obstetrics & Gynecology

## 2022-02-19 DIAGNOSIS — N8111 Cystocele, midline: Secondary | ICD-10-CM

## 2022-02-19 LAB — CBC
Hematocrit: 40.2 % (ref 34.0–47.0)
Hemoglobin: 13.5 g/dL (ref 11.5–15.7)
MCH: 31.1 pg (ref 27.0–34.5)
MCHC: 33.6 g/dL (ref 32.0–36.0)
MCV: 92.6 fL (ref 81.0–99.0)
MPV: 10.9 fL (ref 7.2–13.2)
NRBC Absolute: 0 10*3/uL (ref 0.000–0.012)
NRBC Automated: 0 % (ref 0.0–0.2)
Platelets: 277 10*3/uL (ref 140–440)
RBC: 4.34 x10e6/mcL (ref 3.60–5.20)
RDW: 13.2 % (ref 11.0–16.0)
WBC: 8.9 10*3/uL (ref 3.8–10.6)

## 2022-02-19 LAB — BASIC METABOLIC PANEL
Anion Gap: 13 mmol/L (ref 2–17)
BUN: 9 mg/dL (ref 6–20)
CO2: 25 mmol/L (ref 22–29)
Calcium: 9.3 mg/dL (ref 8.6–10.0)
Chloride: 101 mmol/L (ref 98–107)
Creatinine: 0.6 mg/dL (ref 0.5–1.0)
Est, Glom Filt Rate: 106 mL/min/1.73m (ref >=60)
Glucose: 91 mg/dL (ref 70–99)
OSMOLALITY CALCULATED: 274 mOsm/kg (ref 270–287)
Potassium: 4.1 mmol/L (ref 3.5–5.3)
Sodium: 138 mmol/L (ref 135–145)

## 2022-02-19 LAB — PREGNANCY, URINE: Pregnancy, Urine: NEGATIVE

## 2022-02-19 MED ORDER — KETOROLAC TROMETHAMINE 30 MG/ML IJ SOLN
30 MG/ML | INTRAMUSCULAR | Status: DC | PRN
Start: 2022-02-19 — End: 2022-02-19
  Administered 2022-02-19: 21:00:00 30 via INTRAVENOUS

## 2022-02-19 MED ORDER — TRAMADOL HCL 50 MG PO TABS
50 MG | ORAL_TABLET | Freq: Four times a day (QID) | ORAL | 0 refills | Status: DC | PRN
Start: 2022-02-19 — End: 2022-02-19

## 2022-02-19 MED ORDER — SODIUM CHLORIDE 0.9 % IV SOLN
0.9 % | INTRAVENOUS | Status: DC | PRN
Start: 2022-02-19 — End: 2022-02-19

## 2022-02-19 MED ORDER — TRAMADOL HCL 50 MG PO TABS
50 MG | ORAL | Status: DC | PRN
Start: 2022-02-19 — End: 2022-02-20
  Administered 2022-02-20 (×2): 50 mg via ORAL

## 2022-02-19 MED ORDER — ONDANSETRON 4 MG PO TBDP
4 MG | Freq: Three times a day (TID) | ORAL | Status: DC | PRN
Start: 2022-02-19 — End: 2022-02-20

## 2022-02-19 MED ORDER — IPRATROPIUM-ALBUTEROL 0.5-2.5 (3) MG/3ML IN SOLN
0.5-2.5 (3) MG/3ML | Freq: Once | RESPIRATORY_TRACT | Status: DC | PRN
Start: 2022-02-19 — End: 2022-02-19

## 2022-02-19 MED ORDER — DROPERIDOL 2.5 MG/ML IJ SOLN
2.5 MG/ML | Freq: Once | INTRAMUSCULAR | Status: DC | PRN
Start: 2022-02-19 — End: 2022-02-19

## 2022-02-19 MED ORDER — MIDAZOLAM HCL 2 MG/2ML IJ SOLN
2 MG/ML | INTRAMUSCULAR | Status: AC
Start: 2022-02-19 — End: ?

## 2022-02-19 MED ORDER — GLYCOPYRROLATE 0.4 MG/2ML IJ SOLN
0.4 MG/2ML | INTRAMUSCULAR | Status: DC | PRN
Start: 2022-02-19 — End: 2022-02-19
  Administered 2022-02-19: 20:00:00 .2 via INTRAVENOUS

## 2022-02-19 MED ORDER — LORAZEPAM 2 MG/ML IJ SOLN
2 MG/ML | Freq: Once | INTRAMUSCULAR | Status: DC | PRN
Start: 2022-02-19 — End: 2022-02-19

## 2022-02-19 MED ORDER — KETAMINE HCL 50 MG/5ML IJ SOSY
50 MG/5ML | INTRAMUSCULAR | Status: AC
Start: 2022-02-19 — End: ?

## 2022-02-19 MED ORDER — DROPERIDOL 2.5 MG/ML IJ SOLN
2.5 MG/ML | INTRAMUSCULAR | Status: AC
Start: 2022-02-19 — End: ?

## 2022-02-19 MED ORDER — HYDROMORPHONE HCL 2 MG/ML IJ SOLN
2 MG/ML | INTRAMUSCULAR | Status: DC | PRN
Start: 2022-02-19 — End: 2022-02-20
  Administered 2022-02-20: 12:00:00 0.5 mg via INTRAVENOUS

## 2022-02-19 MED ORDER — LIDOCAINE HCL (PF) 1 % IJ SOLN
1 % | Freq: Once | INTRAMUSCULAR | Status: DC
Start: 2022-02-19 — End: 2022-02-19

## 2022-02-19 MED ORDER — LIDOCAINE HCL (PF) 1 % IJ SOLN
1 % | INTRAMUSCULAR | Status: AC
Start: 2022-02-19 — End: 2022-02-19
  Administered 2022-02-19: 17:00:00 0.1

## 2022-02-19 MED ORDER — NORMAL SALINE FLUSH 0.9 % IV SOLN
0.9 % | Freq: Two times a day (BID) | INTRAVENOUS | Status: DC
Start: 2022-02-19 — End: 2022-02-19

## 2022-02-19 MED ORDER — DEXAMETHASONE SODIUM PHOSPHATE 4 MG/ML IJ SOLN
4 MG/ML | INTRAMUSCULAR | Status: AC
Start: 2022-02-19 — End: ?

## 2022-02-19 MED ORDER — KETAMINE HCL 50 MG/5ML IJ SOSY
50 MG/5ML | INTRAMUSCULAR | Status: DC | PRN
Start: 2022-02-19 — End: 2022-02-19
  Administered 2022-02-19: 20:00:00 10 via INTRAVENOUS
  Administered 2022-02-19: 19:00:00 40 via INTRAVENOUS

## 2022-02-19 MED ORDER — PROPOFOL 200 MG/20ML IV EMUL
200 MG/20ML | INTRAVENOUS | Status: AC
Start: 2022-02-19 — End: ?

## 2022-02-19 MED ORDER — BUPIVACAINE-EPINEPHRINE (PF) 0.25% -1:200000 IJ SOLN
INTRAMUSCULAR | Status: DC | PRN
Start: 2022-02-19 — End: 2022-02-19
  Administered 2022-02-19: 19:00:00 30 via INTRAMUSCULAR

## 2022-02-19 MED ORDER — DEXTROSE IN LACTATED RINGERS 5 % IV SOLN
5 % | INTRAVENOUS | Status: DC
Start: 2022-02-19 — End: 2022-02-19

## 2022-02-19 MED ORDER — LACTATED RINGERS IV SOLN
INTRAVENOUS | Status: DC | PRN
Start: 2022-02-19 — End: 2022-02-19
  Administered 2022-02-19 (×2): via INTRAVENOUS

## 2022-02-19 MED ORDER — TRAMADOL HCL 50 MG PO TABS
50 MG | ORAL_TABLET | Freq: Four times a day (QID) | ORAL | 0 refills | Status: AC | PRN
Start: 2022-02-19 — End: 2022-02-24

## 2022-02-19 MED ORDER — SODIUM CHLORIDE 0.9 % IV SOLN
0.9 % | INTRAVENOUS | Status: DC | PRN
Start: 2022-02-19 — End: 2022-02-20

## 2022-02-19 MED ORDER — NORMAL SALINE FLUSH 0.9 % IV SOLN
0.9 % | INTRAVENOUS | Status: DC | PRN
Start: 2022-02-19 — End: 2022-02-19

## 2022-02-19 MED ORDER — ACETAMINOPHEN 325 MG PO TABS
325 MG | Freq: Once | ORAL | Status: DC | PRN
Start: 2022-02-19 — End: 2022-02-19

## 2022-02-19 MED ORDER — ONDANSETRON HCL 4 MG/2ML IJ SOLN
4 MG/2ML | INTRAMUSCULAR | Status: AC
Start: 2022-02-19 — End: ?

## 2022-02-19 MED ORDER — HYDROMORPHONE HCL 1 MG/ML IJ SOLN
1 MG/ML | INTRAMUSCULAR | Status: AC | PRN
Start: 2022-02-19 — End: 2022-02-19
  Administered 2022-02-19 (×2): 0.5 mg via INTRAVENOUS

## 2022-02-19 MED ORDER — ACETAMINOPHEN 500 MG PO TABS
500 MG | Freq: Once | ORAL | Status: AC
Start: 2022-02-19 — End: 2022-02-19
  Administered 2022-02-19: 17:00:00 1000 mg via ORAL

## 2022-02-19 MED ORDER — SODIUM CHLORIDE (PF) 0.9 % IJ SOLN
0.9 % | INTRAMUSCULAR | Status: AC
Start: 2022-02-19 — End: ?

## 2022-02-19 MED ORDER — PROPOFOL 200 MG/20ML IV EMUL
200 MG/20ML | INTRAVENOUS | Status: DC | PRN
Start: 2022-02-19 — End: 2022-02-19
  Administered 2022-02-19 (×2): 50 via INTRAVENOUS
  Administered 2022-02-19: 19:00:00 150 via INTRAVENOUS

## 2022-02-19 MED ORDER — MELATONIN 5 MG PO TABS
5 MG | Freq: Every evening | ORAL | Status: DC | PRN
Start: 2022-02-19 — End: 2022-02-20

## 2022-02-19 MED ORDER — MEPERIDINE HCL 25 MG/ML IJ SOLN
25 MG/ML | INTRAMUSCULAR | Status: DC | PRN
Start: 2022-02-19 — End: 2022-02-19

## 2022-02-19 MED ORDER — MIDAZOLAM HCL 2 MG/2ML IJ SOLN
2 MG/ML | INTRAMUSCULAR | Status: DC | PRN
Start: 2022-02-19 — End: 2022-02-19
  Administered 2022-02-19: 19:00:00 2 via INTRAVENOUS

## 2022-02-19 MED ORDER — KETOROLAC TROMETHAMINE 30 MG/ML IJ SOLN
30 MG/ML | INTRAMUSCULAR | Status: AC
Start: 2022-02-19 — End: ?

## 2022-02-19 MED ORDER — DEXAMETHASONE SODIUM PHOSPHATE 4 MG/ML IJ SOLN
4 MG/ML | INTRAMUSCULAR | Status: DC | PRN
Start: 2022-02-19 — End: 2022-02-19
  Administered 2022-02-19: 19:00:00 4 via INTRAVENOUS

## 2022-02-19 MED ORDER — DIPHENHYDRAMINE HCL 50 MG/ML IJ SOLN
50 MG/ML | Freq: Once | INTRAMUSCULAR | Status: DC | PRN
Start: 2022-02-19 — End: 2022-02-19

## 2022-02-19 MED ORDER — ACETAMINOPHEN 500 MG PO TABS
500 MG | Freq: Three times a day (TID) | ORAL | Status: DC
Start: 2022-02-19 — End: 2022-02-20
  Administered 2022-02-20 (×2): 1000 mg via ORAL

## 2022-02-19 MED ORDER — SUGAMMADEX SODIUM 200 MG/2ML IV SOLN
200 MG/2ML | INTRAVENOUS | Status: DC
Start: 2022-02-19 — End: 2022-02-19

## 2022-02-19 MED ORDER — LABETALOL HCL 5 MG/ML IV SOLN
5 MG/ML | INTRAVENOUS | Status: DC | PRN
Start: 2022-02-19 — End: 2022-02-19

## 2022-02-19 MED ORDER — LACTATED RINGERS IV SOLN
INTRAVENOUS | Status: DC
Start: 2022-02-19 — End: 2022-02-19
  Administered 2022-02-19: 17:00:00 via INTRAVENOUS

## 2022-02-19 MED ORDER — ONDANSETRON HCL 4 MG/2ML IJ SOLN
4 MG/2ML | INTRAMUSCULAR | Status: DC | PRN
Start: 2022-02-19 — End: 2022-02-19
  Administered 2022-02-19: 21:00:00 4 via INTRAVENOUS

## 2022-02-19 MED ORDER — CEFAZOLIN IN SODIUM CHLORIDE 2-0.9 GM/100ML-% IV SOLN
INTRAVENOUS | Status: DC
Start: 2022-02-19 — End: 2022-02-19

## 2022-02-19 MED ORDER — HYDROMORPHONE HCL 1 MG/ML IJ SOLN
1 MG/ML | INTRAMUSCULAR | Status: DC | PRN
Start: 2022-02-19 — End: 2022-02-20

## 2022-02-19 MED ORDER — LIDOCAINE HCL (PF) 2 % IJ SOLN
2 % | INTRAMUSCULAR | Status: AC
Start: 2022-02-19 — End: ?

## 2022-02-19 MED ORDER — LIDOCAINE HCL (PF) 2 % IJ SOLN
2 % | INTRAMUSCULAR | Status: DC | PRN
Start: 2022-02-19 — End: 2022-02-19
  Administered 2022-02-19: 19:00:00 80 via INTRAVENOUS

## 2022-02-19 MED ORDER — HYDROMORPHONE HCL 2 MG/ML IJ SOLN
2 MG/ML | INTRAMUSCULAR | Status: AC
Start: 2022-02-19 — End: ?

## 2022-02-19 MED ORDER — KETOROLAC TROMETHAMINE 15 MG/ML IJ SOLN
15 MG/ML | Freq: Three times a day (TID) | INTRAMUSCULAR | Status: DC
Start: 2022-02-19 — End: 2022-02-20
  Administered 2022-02-20 (×2): 30 mg via INTRAVENOUS

## 2022-02-19 MED ORDER — FENTANYL CITRATE (PF) 100 MCG/2ML IJ SOLN
100 | INTRAMUSCULAR | Status: DC | PRN
Start: 2022-02-19 — End: 2022-02-19

## 2022-02-19 MED ORDER — ONDANSETRON HCL 4 MG/2ML IJ SOLN
4 MG/2ML | Freq: Once | INTRAMUSCULAR | Status: DC | PRN
Start: 2022-02-19 — End: 2022-02-19

## 2022-02-19 MED ORDER — HYDRALAZINE HCL 20 MG/ML IJ SOLN
20 MG/ML | INTRAMUSCULAR | Status: DC | PRN
Start: 2022-02-19 — End: 2022-02-19

## 2022-02-19 MED ORDER — HYDROMORPHONE HCL 2 MG/ML IJ SOLN
2 MG/ML | INTRAMUSCULAR | Status: DC | PRN
Start: 2022-02-19 — End: 2022-02-19
  Administered 2022-02-19 (×4): .5 via INTRAVENOUS

## 2022-02-19 MED ORDER — FAMOTIDINE 20 MG PO TABS
20 MG | Freq: Every day | ORAL | Status: DC
Start: 2022-02-19 — End: 2022-02-20

## 2022-02-19 MED ORDER — GLYCOPYRROLATE 0.4 MG/2ML IJ SOLN
0.4 MG/2ML | INTRAMUSCULAR | Status: AC
Start: 2022-02-19 — End: ?

## 2022-02-19 MED ORDER — ONDANSETRON HCL 4 MG/2ML IJ SOLN
4 MG/2ML | Freq: Four times a day (QID) | INTRAMUSCULAR | Status: DC | PRN
Start: 2022-02-19 — End: 2022-02-20
  Administered 2022-02-20: 06:00:00 4 mg via INTRAVENOUS

## 2022-02-19 MED ORDER — DEXTROSE-NACL 5-0.45 % IV SOLN
INTRAVENOUS | Status: DC
Start: 2022-02-19 — End: 2022-02-20

## 2022-02-19 MED FILL — MIDAZOLAM HCL 2 MG/2ML IJ SOLN: 2 MG/ML | INTRAMUSCULAR | Qty: 2

## 2022-02-19 MED FILL — CEFAZOLIN IN SODIUM CHLORIDE 2-0.9 GM/100ML-% IV SOLN: INTRAVENOUS | Qty: 100

## 2022-02-19 MED FILL — HYDROMORPHONE HCL 2 MG/ML IJ SOLN: 2 MG/ML | INTRAMUSCULAR | Qty: 1

## 2022-02-19 MED FILL — DILAUDID 1 MG/ML IJ SOLN: 1 MG/ML | INTRAMUSCULAR | Qty: 0.5

## 2022-02-19 MED FILL — DROPERIDOL 2.5 MG/ML IJ SOLN: 2.5 MG/ML | INTRAMUSCULAR | Qty: 2

## 2022-02-19 MED FILL — SM PAIN RELIEVER EX ST 500 MG PO TABS: 500 MG | ORAL | Qty: 2

## 2022-02-19 MED FILL — ONDANSETRON 4 MG PO TBDP: 4 MG | ORAL | Qty: 1

## 2022-02-19 MED FILL — SODIUM CHLORIDE (PF) 0.9 % IJ SOLN: 0.9 % | INTRAMUSCULAR | Qty: 10

## 2022-02-19 MED FILL — DIPRIVAN 200 MG/20ML IV EMUL: 200 MG/20ML | INTRAVENOUS | Qty: 20

## 2022-02-19 MED FILL — BRIDION 200 MG/2ML IV SOLN: 200 MG/2ML | INTRAVENOUS | Qty: 4

## 2022-02-19 MED FILL — GLYCOPYRROLATE 0.4 MG/2ML IJ SOLN: 0.4 MG/2ML | INTRAMUSCULAR | Qty: 2

## 2022-02-19 MED FILL — LIDOCAINE HCL (PF) 2 % IJ SOLN: 2 % | INTRAMUSCULAR | Qty: 5

## 2022-02-19 MED FILL — FAMOTIDINE 20 MG PO TABS: 20 MG | ORAL | Qty: 1

## 2022-02-19 MED FILL — DEXAMETHASONE SODIUM PHOSPHATE 4 MG/ML IJ SOLN: 4 MG/ML | INTRAMUSCULAR | Qty: 1

## 2022-02-19 MED FILL — KETAMINE HCL 50 MG/5ML IJ SOSY: 50 MG/5ML | INTRAMUSCULAR | Qty: 5

## 2022-02-19 MED FILL — MELATONIN MAXIMUM STRENGTH 5 MG PO TABS: 5 MG | ORAL | Qty: 1

## 2022-02-19 MED FILL — KETOROLAC TROMETHAMINE 30 MG/ML IJ SOLN: 30 MG/ML | INTRAMUSCULAR | Qty: 1

## 2022-02-19 MED FILL — XYLOCAINE-MPF 1 % IJ SOLN: 1 % | INTRAMUSCULAR | Qty: 2

## 2022-02-19 MED FILL — ONDANSETRON HCL 4 MG/2ML IJ SOLN: 4 MG/2ML | INTRAMUSCULAR | Qty: 2

## 2022-02-19 NOTE — Anesthesia Post-Procedure Evaluation (Signed)
Department of Anesthesiology  Postprocedure Note    Patient: Akshita Italiano  MRN: [id number]  Birthdate: 1965-12-28  Date of evaluation: 02/19/2022      Procedure Summary     Date: 02/19/22 Room / Location: [facility] OR 08 / [facility] MAIN OR    Anesthesia Start: 1436 Anesthesia Stop: 1659    Procedures:       HYSTERECTOMY VAGINAL / BILATERAL SALPINGECTOMY      VAGINAL REPAIR ANTERIOR Diagnosis:       Abnormal uterine bleeding      Cystocele, midline      (Abnormal uterine bleeding [N93.9])      (Cystocele, midline [N81.11])    Surgeons: Shelda Altes, MD Responsible Provider: Gretta Began, MD    Anesthesia Type: General ASA Status: 2          Anesthesia Type: General    Aldrete Phase I: Aldrete Score: 10    Aldrete Phase II:        Anesthesia Post Evaluation    Patient location during evaluation: PACU  Patient participation: complete - patient participated  Level of consciousness: awake  Airway patency: patent  Nausea & Vomiting: no nausea  Complications: no  Cardiovascular status: hemodynamically stable  Respiratory status: acceptable  Hydration status: euvolemic

## 2022-02-19 NOTE — Anesthesia Procedure Notes (Signed)
Airway  Date/Time: 02/19/2022 2:51 PM  Urgency: elective    Airway not difficult    General Information and Staff    Patient location during procedure: OR  Anesthesiologist: De Burrs, MD  Resident/CRNA: Jacobo Forest, APRN - CRNA  Performed: resident/CRNA     Indications and Patient Condition  Indications for airway management: anesthesia  Spontaneous ventilation: present  Sedation level: moderate (conscious sedation)  Preoxygenated: yes  Patient position: sniffing  MILS not maintained throughout  Mask difficulty assessment: not attempted    Final Airway Details  Final airway type: supraglottic airway      Successful airway: oropharyngeal  Size 4     Number of attempts at approach: 1  Ventilation between attempts: bag mask  Number of other approaches attempted: 0    Additional Comments  ATRAUMATIC PLACEMENT OF # 4 LMA. TONGUE MIDLINE. NO PRESSURE NOTED TO TONGUE/LIPS. LIPS/TEETH AS PREOP  no

## 2022-02-19 NOTE — Progress Notes (Signed)
Postop prescription tramadol printed for patient to discharge at time of surgery 02/19/2022.

## 2022-02-19 NOTE — Anesthesia Pre-Procedure Evaluation (Signed)
Department of Anesthesiology  Preprocedure Note       Name:  Sandra Welch   Age:  56 y.o.  DOB:  Feb 05, 1966                                          MRN:  [id number]         Date:  02/19/2022      Surgeon: Moishe Spice):  Shelda Altes, MD    Procedure: Procedure(s):  HYSTERECTOMY VAGINAL / SALPINGECTOMY  VAGINAL REPAIR ANTERIOR AND POSTERIOR    Medications prior to admission:   Prior to Admission medications    Medication Sig Start Date End Date Taking? Authorizing Provider   Magnesium 300 MG CAPS Take 1 tablet by mouth every morning   Yes Historical Provider, MD   ibuprofen (ADVIL;MOTRIN) 600 MG tablet Take 1 tablet by mouth 3 times daily as needed for Pain 02/12/22   Shelda Altes, MD   ARMOUR THYROID 120 MG tablet Take 120 mg by mouth nightly 08/12/21   Historical Provider, MD   ergocalciferol (ERGOCALCIFEROL) 1.25 MG (50000 UT) capsule Take 50,000 Units by mouth every morning Liquid in smoothie every morning 03/05/16   Historical Provider, MD       Current medications:    No current facility-administered medications for this encounter.     Current Outpatient Medications   Medication Sig Dispense Refill   ??? Magnesium 300 MG CAPS Take 1 tablet by mouth every morning     ??? ibuprofen (ADVIL;MOTRIN) 600 MG tablet Take 1 tablet by mouth 3 times daily as needed for Pain 30 tablet 0   ??? ARMOUR THYROID 120 MG tablet Take 120 mg by mouth nightly     ??? ergocalciferol (ERGOCALCIFEROL) 1.25 MG (50000 UT) capsule Take 50,000 Units by mouth every morning Liquid in smoothie every morning         Allergies:    Allergies   Allergen Reactions   ??? Codeine Hives and Itching   ??? Thyroid Hormones Hives and Itching       Problem List:    Patient Active Problem List   Diagnosis Code   ??? Cystocele, midline N81.11   ??? Pelvic floor weakness N81.89   ??? Urinary incontinence in female R32   ??? Abnormal uterine bleeding (AUB) N93.9       Past Medical History:        Diagnosis Date   ??? DUB (dysfunctional uterine bleeding)    ??? Ehlers-Danlos syndrome      connective tissue disorder   ??? Migraines    ??? Thyroid disease        Past Surgical History:        Procedure Laterality Date   ??? CESAREAN SECTION     ??? CHOLECYSTECTOMY     ??? EXCISIONAL HEMORRHOIDECTOMY     ??? TONSILLECTOMY     ??? TUBAL LIGATION         Social History:    Social History     Tobacco Use   ??? Smoking status: Never   ??? Smokeless tobacco: Never   Substance Use Topics   ??? Alcohol use: Yes     Alcohol/week: 1.0 standard drink     Types: 1 Glasses of wine per week  Counseling given: Not Answered      Vital Signs (Current):   Vitals:    02/08/22 1303   Weight: 195 lb (88.5 kg)   Height: 5\' 7"  (1.702 m)                                              BP Readings from Last 3 Encounters:   No data found for BP       NPO Status:                                                                                 BMI:   Wt Readings from Last 3 Encounters:   No data found for Wt     Body mass index is 30.54 kg/m??.    CBC: No results found for: WBC, RBC, HGB, HCT, MCV, RDW, PLT    CMP: No results found for: NA, K, CL, CO2, BUN, CREATININE, GFRAA, AGRATIO, LABGLOM, GLUCOSE, GLU, PROT, CALCIUM, BILITOT, ALKPHOS, AST, ALT    POC Tests: No results for input(s): POCGLU, POCNA, POCK, POCCL, POCBUN, POCHEMO, POCHCT in the last 72 hours.    Coags: No results found for: PROTIME, INR, APTT    HCG (If Applicable): No results found for: PREGTESTUR, PREGSERUM, HCG, HCGQUANT     ABGs: No results found for: PHART, PO2ART, PCO2ART, HCO3ART, BEART, O2SATART     Type & Screen (If Applicable):  No results found for: LABABO, LABRH    Drug/Infectious Status (If Applicable):  No results found for: HIV, HEPCAB    COVID-19 Screening (If Applicable): No results found for: COVID19        Anesthesia Evaluation  Patient summary reviewed and Nursing notes reviewed  Airway: Mallampati: II          Dental: normal exam         Pulmonary:Negative Pulmonary ROS and normal exam                                Cardiovascular:Negative CV ROS            Rhythm: regular  Rate: normal                    Neuro/Psych:   (+) headaches:,             GI/Hepatic/Renal: Neg GI/Hepatic/Renal ROS            Endo/Other:    (+) hypothyroidism::., .                 Abdominal:             Vascular: negative vascular ROS.         Other Findings:           Anesthesia Plan      general     ASA 2     (ETT   LABS  CBC/CMP pnding)  Induction: intravenous.      Anesthetic plan and risks discussed with patient.  Plan discussed with CRNA.    Attending anesthesiologist reviewed and agrees with Preprocedure content                De Burrs, MD   02/19/2022

## 2022-02-19 NOTE — Op Note (Signed)
Operative Note      Patient: Sandra Welch  Date of Birth: 05/07/1966  MRN: [id number]    Date of Procedure: 02/19/2022    Pre-Op Diagnosis: Abnormal uterine bleeding [N93.9]  Cystocele, midline [N81.11]    Post-Op Diagnosis: Same       Procedure(s):  HYSTERECTOMY VAGINAL / BILATERAL SALPINGECTOMY  VAGINAL REPAIR ANTERIOR    Surgeon(s):  Shelda Altes, MD    Assistant:   * No surgical staff found *  Procedure start time: 1505   Major end time: 1645  Anesthesia: General    Estimated Blood Loss (mL): less than 100     Complications: None    Specimens:   ID Type Source Tests Collected by Time Destination   A : uterus,cervix,right and left fallopian tubes Tissue Tissue SURGICAL PATHOLOGY Shelda Altes, MD 02/19/2022 1609        Implants:  * No implants in log *      Drains: * No LDAs found *    Findings: 1. Normal external female genitalia 2.  Normal cervix and findings intraoperatively with uterine scarring consistent with prior cesarean delivery.  3.  Normal uterine fundus and fallopian tube changes consistent with prior tubal sterilization.  4.  Normal ovaries bilaterally 5.  Normal cystourethroscopy    Detailed Description of Procedure:     INDICATIONS FOR THE PROCEDURE: Ms. 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  On exam, she had Stage Stage anterior compartment prolapse. The patient desired definitive treatment. The risks and benefits of surgery included but not limited to injury to the bowel, bladder, ureter, internal organs; possibility of nerve entrapment and nerve injury; possibility of bleeding with subsequent transfusion were all discussed with the patient at a great length. She was also counseled on the risks of damage to internal organs, voiding dysfunction, defecatory dysfunction, dyspareunia, recurrent prolapse, recurrent incontinence, DVT, PE, and death. Consents was then  obtained for total vaginal hysterectomy with bilateral salpingectomy and anterior vaginal wall prolapse repair.      DESCRIPTION OF THE PROCEDURE: Intravenous fluids, antibiotic prophylaxis and SCD hose were initiated. The patient was taken to the operating room where general anesthesia was administered and endotracheal intubation performed successfully. She was then positioned in lithotomy with legs padded and placed in dolphin stirrups. Her arms were secured and padded. She was then prepped and draped in sterile fashion. A 16 Fr. Foley catheter was placed.  Surgical pause confirming patient identification, procedure and instruments for procedure was performed.  The vaginal weighted speculum and [place] retractor were placed for vaginal exposure.  The cervix was visualized and tenaculum clamps applied.  The uterus was then placed on traction and circumferential injection of 0.25% Marcaine/1 200,000 epinephrine was performed for tumescence and analgesia.  Circumstantial vaginal incision was then made at the cervical vaginal junction.  Posterior dissection was performed with sharp entry into the posterior cul-de-sac and peritoneal cavity entry confirmed visually.  The long weighted speculum was placed.  Vesicovaginal space dissection was then carried out in a sharp fashion.  The uterosacral and cardinal ligaments were then secured with the LigaSure bipolar forcep, sealed, and then cut.  The vesicovaginal space was further dissected until the peritoneal cavity was then entered sharply and confirmed visually.  The uterine vessels bilaterally were then sealed and cut with the LigaSure bipolar forcep.  Broad ligament and round ligament structures bilaterally were subsequently  sealed and cut sequentially with the LigaSure bipolar forceps.  The bowels were then packed into the deep pelvis with a laparotomy sponge.  To allow for better visualization the cervix and lower uterine segment within resected sharply with  scalpel.  The left utero-ovarian pedicle was then sealed and transected with bipolar forceps and this was repeated on the right.  The utero-ovarian pedicles were then delivered to the vagina at which time the ovaries were visualized and appeared normal.  The right and left fallopian tubes were then removed with bipolar transection of the mesosalpinx.  The pelvis was then irrigated.  All pedicles appeared hemostatic.    Cystourethroscopy was then performed with a 70 degree rigid cystoscope.  The urethra, trigone of the bladder, and bladder urothelium surfaces were inspected and were intact without defects, abnormalities or other pathology.  Brisk efflux of clear urine was noted from both ureters.  The bladder was once again drained via 16 French Foley catheter.  The vaginal cuff was then closed with 0 Vicryl suture in a running fashion.   The anterior repair was performed with Allises applied to the mid anterior vaginal wall at the midline placing on traction.  Analgesic/tumescent injection was performed in midline incision made from the urethrovesical junction to the upper third of the vagina.  Vaginal epithelium was dissected off the endopelvic fascia of the bladder.  Running imbricated cystocele repair was performed with 2-0 Monocryl suture.  Excess of vaginal epithelium was then removed sharply and vaginal skin closure performed with running 2-0 Monocryl suture.  Time the vagina was inspected.  Incision lines were intact and hemostatic.    The Foley catheter was removed for voiding trials.  Sponge, lap and needle counts were correct x 2.  The patient tolerated the procedure well and was taken to the recovery room in stable condition post extubation.  All specimens were sent for pathology and case reviewed with circulating nurse.      Electronically signed by Shelda Altes, MD on 02/19/2022 at 5:09 PM

## 2022-02-19 NOTE — H&P (Signed)
HISTORY AND PHYSICAL             Date: 02/19/2022        Patient Name: Sandra Welch     Date of Birth: 17-May-1966      Age:  56 y.o.    Chief Complaint        ???Presents for surgery to treat abnormal uterine bleeding and prolapse???    History Obtained From   patient    History of Present Illness   Ms. Sandra Welch is a 56 year old who presents for scheduled hysterectomy and anterior repair to treat abnormal uterine bleeding and anterior vaginal prolapse (cystocele).   Her workup included hormone therapy, normal endometrial biopsy and ultrasound over the past 2 years. She lives in [place], [place].   Reviewed her history and goals for treatment.  Surgical and nonsurgical options were reviewed.  The procedure was outlined as well as anticipated perioperative course.  Surgical risk to include but not limited to intraoperative events related to bowel and bladder injury, bleeding and/or anesthesia events.  Postoperative risk of pelvic infection, voiding dysfunction, UTI and need for rehospitalization consultation were discussed.  She reports understanding.  She does express desire to be discharged same day.  Discussed staying at a local hotel following surgery and departing for home on postop day 1 due to the length of drive between her home and the hospital location.  She acknowledged understanding.  Also discussed potential need for hospitalization if unexpected perioperative event worse.    Past Medical History     Past Medical History:   Diagnosis Date    DUB (dysfunctional uterine bleeding)     Ehlers-Danlos syndrome     connective tissue disorder    Migraines     Thyroid disease         Past Surgical History     Past Surgical History:   Procedure Laterality Date    CESAREAN SECTION      CHOLECYSTECTOMY      EXCISIONAL HEMORRHOIDECTOMY      TONSILLECTOMY      TUBAL LIGATION          Medications Prior to Admission     Prior to Admission medications    Medication Sig Start Date End Date Taking? Authorizing Provider    Magnesium 300 MG CAPS Take 1 tablet by mouth every morning   Yes Historical Provider, MD   ibuprofen (ADVIL;MOTRIN) 600 MG tablet Take 1 tablet by mouth 3 times daily as needed for Pain 02/12/22   Shelda Altes, MD   ARMOUR THYROID 120 MG tablet Take 120 mg by mouth nightly 08/12/21   Historical Provider, MD   ergocalciferol (ERGOCALCIFEROL) 1.25 MG (50000 UT) capsule Take 50,000 Units by mouth every morning Liquid in smoothie every morning 03/05/16   Historical Provider, MD        Allergies   Codeine and Thyroid hormones    Social History     Social History       Tobacco History       Smoking Status  Never      Smokeless Tobacco Use  Never              Alcohol History       Alcohol Use Status  Yes Drinks/Week  1 Glasses of wine per week Amount  1.0 standard drink of alcohol/wk              Drug Use       Drug  Use Status  Not Asked              Sexual Activity       Sexually Active  Not Asked                    Family History   History reviewed. No pertinent family history.    Review of Systems   Review of Systems  Genitourinary:         Irregular menses  Prolapse   All other systems reviewed and are negative.     Physical Exam   Ht 5\' 7"  (1.702 m)    Wt 195 lb (88.5 kg)    BMI 30.54 kg/m??     Physical Exam  General: No acute distress, pleasant affect  Head/Ears/Nose/Throat: Normocephalic, atraumatic  Skin: No obvious lesions, rashes, or bruises  Lungs: non-labored respirations  Cardiovascular: Warm and well perfused  Neurological: alert and responsive, able to ambulate and follow commands   Abdomen: soft, non-tender, no masses, normal bowel sounds      Pelvic Examination:  Vulva: Normal vulva: no lesions, masses, epithelial changes, or exudate Sensation: within normal limits  Vagina: Normal without lesion     Cervix: Normal; no lesion; dark post menstrual blood  Urethra: {Hypermobility     Procedures/Test:  Simple Cystometry: no     PVR 100 ml     Cough stress test: Negative     Bimanual:anteroflexed uterus      Pelvic floor muscle contraction strength (Kegel): 2      Rectal Examination:     Rectal: not done     PB intact yes - no lesions     Myofascial Pelvic Pain Assessment:     Levator ani: No     Obturator internus: No     Periurethral pain: No     POP-Q Examination:  Aa -1 Ba -1 C -9   GH  4 PB   4 TVL 10   Ap-3 Bp -3 D -11      Labs    No results found for this or any previous visit (from the past 24 hour(s)).       Assessment      Hospital Problems             Last Modified POA    Cystocele, midline 02/12/2022 Unknown    Abnormal uterine bleeding (AUB) 02/12/2022 Unknown       Plan   Plan to proceed with TVH/Salpingectomy and Anterior repair  Anticipate outpatient and same day discharge  Reviewed postop expectations, precautions and restrictions  Perioperative packet reviewed    Consultations Ordered:  None    Electronically signed by 04/14/2022, MD on 02/19/22 at 7:32 AM EDT

## 2022-02-19 NOTE — Discharge Instructions (Signed)
Postop instructions provided to patient at preop visit and her urogynecology surgery packet

## 2022-02-20 MED ORDER — NORMAL SALINE FLUSH 0.9 % IV SOLN
0.9 % | INTRAVENOUS | Status: DC
Start: 2022-02-20 — End: 2022-02-20

## 2022-02-20 MED FILL — KETOROLAC TROMETHAMINE 15 MG/ML IJ SOLN: 15 MG/ML | INTRAMUSCULAR | Qty: 2

## 2022-02-20 MED FILL — TRAMADOL HCL 50 MG PO TABS: 50 MG | ORAL | Qty: 1

## 2022-02-20 MED FILL — SM PAIN RELIEVER EX ST 500 MG PO TABS: 500 MG | ORAL | Qty: 2

## 2022-02-20 MED FILL — FAMOTIDINE 20 MG PO TABS: 20 MG | ORAL | Qty: 1

## 2022-02-20 MED FILL — BD POSIFLUSH 0.9 % IV SOLN: 0.9 % | INTRAVENOUS | Qty: 20

## 2022-02-20 MED FILL — HYDROMORPHONE HCL 2 MG/ML IJ SOLN: 2 MG/ML | INTRAMUSCULAR | Qty: 1

## 2022-02-20 MED FILL — ONDANSETRON HCL 4 MG/2ML IJ SOLN: 4 MG/2ML | INTRAMUSCULAR | Qty: 2

## 2022-02-20 NOTE — Plan of Care (Signed)
Discharge teaching and paperwork given and explained to patient and husband at this time. All pt questions and concerns were addressed. Pt states understanding of discharge instructions, medication instructions, and when to call 911. Pt is ready to be discharged and is waiting for a transport wheelchair at this time.

## 2022-02-20 NOTE — Discharge Summary (Signed)
Discharge Summary    Date: 02/20/2022  Patient Name: Sandra Welch    Date of Birth: 01/31/1966     Age: 56 y.o.    Admit Date: 02/19/2022  Discharge Date:  Discharge Condition: Good    Admission Diagnosis  Abnormal uterine bleeding [N93.9];Cystocele, midline [N81.11];Abnormal uterine bleeding (AUB) [N93.9]      Discharge Diagnosis  Active Problems:    Cystocele, midline    Abnormal uterine bleeding (AUB)  Resolved Problems:    * No resolved hospital problems. [facility] Stay  Narrative of Hospital Course:  Sandra Welch is a 56 year old who presented with abnormal uterine bleeding and vaginal prolapse for surgical management on 02/19/2022.  She underwent total vaginal hysterectomy with bilateral salpingectomy and anterior vaginal repair.  During observation she progressed as expected with return of bladder function and demonstrated spontaneous voids of 100 - 200 mL with 50% residual.  Her pain was controlled with oral analgesics.  She did express concerns with migraine headache on postop day #1 which was managed medically.  She was given routine postoperative instructions and restrictions to include activity and lifting limitations.    Consultants:  None    Surgeries/procedures Performed:  02/19/2022 date of surgery  Procedures:  1.  Total vaginal hysterectomy with bilateral salpingectomy  2.  Anterior pair  3.  Cystourethroscopy     Treatments:    Surgery        Discharge Plan/Disposition:  Home    Hospital/Incidental Findings Requiring Follow Up:    Patient Instructions:    Diet:    Activity:No Lifting, Driving or Strenuous Excercise, No Sex for, No Driving While on Analgesics and No Heavy Lifting  For number of days (if applicable): 7      Other Instructions: Duration limitations are 1 week with exception of continued pelvic rest to include no intercourse for 4 weeks.    Provider Follow-Up:   No follow-ups on file.     Significant Diagnostic Studies:    Recent Labs:  Admission on 02/19/2022  Pregnancy, Urine                               Date: 02/19/2022  Value: Negative      Status: Final  Sodium                                        Date: 02/19/2022  Value: 138         Ref range: 135 - 145 mmol/L   Status: Final  Potassium                                     Date: 02/19/2022  Value: 4.1         Ref range: 3.5 - 5.3 mmol/L   Status: Final  Chloride                                      Date: 02/19/2022  Value: 101         Ref range: 98 - 107 mmol/L    Status: Final  CO2  Date: 02/19/2022  Value: 25          Ref range: 22 - 29 mmol/L     Status: Final  Glucose                                       Date: 02/19/2022  Value: 91          Ref range: 70 - 99 mg/dL      Status: Final  BUN                                           Date: 02/19/2022  Value: 9           Ref range: 6 - 20 mg/dL       Status: Final  Creatinine                                    Date: 02/19/2022  Value: 0.6         Ref range: 0.5 - 1.0 mg/dL    Status: Final  Anion Gap                                     Date: 02/19/2022  Value: 13          Ref range: 2 - 17 mmol/L      Status: Final  OSMOLALITY CALCULATED                         Date: 02/19/2022  Value: 274         Ref range: 270 - 287 mOsm/kg  Status: Final  Calcium                                       Date: 02/19/2022  Value: 9.3         Ref range: 8.6 - 10.0 mg/dL   Status: Final  Est, Glom Filt Rate                           Date: 02/19/2022  Value: 106         Ref range: >=60 mL/min/1.73*  Status: Final                Comment: VERIFIED by Discern Expert.  GFR Interpretation:                                                                         % OF  KIDNEY  GFR  STAGE  FUNCTION  ==================================================================================    > 90        Normal kidney function                       STAGE 1  90-100%  89 to 60      Mild loss of kidney function                 STAGE  2  80-60%  59 to 45      Mild to moderate loss of kidney function     STAGE 3a  59-45%  44 to 30      Moderate to severe loss of kidney function   STAGE 3b  44-30%  29 to 15      Severe loss of kidney function               STAGE 4  29-15%    < 15        Kidney failure                               STAGE 5  <15%  ==================================================================================  Modified from [employer]    GFR Calculation performed using the CKD-EPI 2021 equation developed for use  with IDMS traceable creatinine methods and                          is the calculation recommended by  the [facility] for estimating GFR in adults.    WBC                                           Date: 02/19/2022  Value: 8.9         Ref range: 3.8 - 10.6 x10e3*  Status: Final  RBC                                           Date: 02/19/2022  Value: 4.34        Ref range: 3.60 - 5.20 x10e*  Status: Final  Hemoglobin                                    Date: 02/19/2022  Value: 13.5        Ref range: 11.5 - 15.7 g/dL   Status: Final  Hematocrit                                    Date: 02/19/2022  Value: 40.2        Ref range: 34.0 - 47.0 %      Status: Final  MCV                                           Date: 02/19/2022  Value: 92.6        Ref range: 81.0 - 99.0 fL     Status: Final  MCH  Date: 02/19/2022  Value: 31.1        Ref range: 27.0 - 34.5 pg     Status: Final  MCHC                                          Date: 02/19/2022  Value: 33.6        Ref range: 32.0 - 36.0 g/dL   Status: Final  RDW                                           Date: 02/19/2022  Value: 13.2        Ref range: 11.0 - 16.0 %      Status: Final  Platelets                                     Date: 02/19/2022  Value: 277         Ref range: 140 - 440 x10e3/*  Status: Final  MPV                                           Date: 02/19/2022  Value: 10.9        Ref range: 7.2 - 13.2 fL       Status: Final  NRBC Automated                                Date: 02/19/2022  Value: 0.0         Ref range: 0.0 - 0.2 %        Status: Final  NRBC Absolute                                 Date: 02/19/2022  Value: 0.000       Ref range: 0.000 - 0.012 x1*  Status: Final  ------------    Radiology last 7 days:  No results found.     Pending Labs     Order Current Status    Surgical Pathology Collected (02/19/22 1609)        Discharge Medications    Current Discharge Medication List    START taking these medications    traMADol (ULTRAM) 50 MG tablet  Take 1 tablet by mouth every 6 hours as needed for Pain for up to 5 days. Intended supply: 3 days. Take lowest dose possible to manage pain Max Daily Amount: 200 mg  Qty: 18 tablet Refills: 0  Comments: Reduce doses taken as pain becomes manageable  Associated Diagnoses:Abnormal uterine bleeding (AUB); Postoperative pain          Current Discharge Medication List        Current Discharge Medication List    CONTINUE these medications which have NOT CHANGED    NALTREXONE HCL PO    0 Refill(s)    Magnesium 300 MG CAPS  Take 1 tablet by mouth every morning    ibuprofen (ADVIL;MOTRIN) 600 MG tablet  Take 1 tablet by  mouth 3 times daily as needed for Pain  Qty: 30 tablet Refills: 0    ARMOUR THYROID 120 MG tablet  Take 120 mg by mouth nightly    ergocalciferol (ERGOCALCIFEROL) 1.25 MG (50000 UT) capsule  Take 50,000 Units by mouth every morning Liquid in smoothie every morning          Current Discharge Medication List        Time Spent on Discharge:  minutes were spent in patient examination, evaluation, counseling as well as medication reconciliation, prescriptions for required medications, discharge plan, and follow up.    Electronically signed by Shelda Altes, MD on 02/20/22 at 7:43 AM EDT

## 2022-02-26 ENCOUNTER — Telehealth: Admit: 2022-02-26 | Discharge: 2022-02-26 | Payer: BLUE CROSS/BLUE SHIELD | Attending: Obstetrics & Gynecology

## 2022-02-26 DIAGNOSIS — Z09 Encounter for follow-up examination after completed treatment for conditions other than malignant neoplasm: Secondary | ICD-10-CM

## 2022-02-26 NOTE — Progress Notes (Signed)
02/26/2022       HPI:     Name: Sandra Welch  Date of Birth: Jun 10, 1966  Surgery: 02/19/2022  Procedure:  HYSTERECTOMY VAGINAL / BILATERAL SALPINGECTOMY  VAGINAL REPAIR ANTERIOR  CC:  post op evaluation.  HPI:   Identity confirmed and patient consented for telemedicine virtual visit for postop exam TVH, salpingectomy and  Sandra Welch returns for a 1 week postop visit.   Reviewed history and postop course. In the interim she reports:  Voiding difficulty: No  Initiating stream normal: Yes  Force stream normal: No  Incontinence: no   Urgency without incontinence: No   Frequency without incontinence: No   Bowel functions: normal   Pain Controlled: Yes  Pathology: benign    Past Medical History:   Past Medical History:   Diagnosis Date    DUB (dysfunctional uterine bleeding)     Ehlers-Danlos syndrome     connective tissue disorder    Migraines     Thyroid disease      Past Surgical History:   Past Surgical History:   Procedure Laterality Date    CESAREAN SECTION      CHOLECYSTECTOMY      EXCISIONAL HEMORRHOIDECTOMY      HYSTERECTOMY, VAGINAL N/A 02/19/2022    HYSTERECTOMY VAGINAL / BILATERAL SALPINGECTOMY performed by Shelda Altes, MD at [facility] MAIN OR    TONSILLECTOMY      TUBAL LIGATION      VAGINA SURGERY N/A 02/19/2022    VAGINAL REPAIR ANTERIOR performed by Shelda Altes, MD at [facility] MAIN OR     Current Medications:  Current Outpatient Medications   Medication Sig Dispense Refill    NALTREXONE HCL PO   0 Refill(s)      ibuprofen (ADVIL;MOTRIN) 600 MG tablet Take 1 tablet by mouth 3 times daily as needed for Pain 30 tablet 0    Magnesium 300 MG CAPS Take 1 tablet by mouth every morning      ARMOUR THYROID 120 MG tablet Take 120 mg by mouth nightly      ergocalciferol (ERGOCALCIFEROL) 1.25 MG (50000 UT) capsule Take 50,000 Units by mouth every morning Liquid in smoothie every morning       No current facility-administered medications for this visit.     Allergies:   Allergies   Allergen Reactions    Codeine Hives and  Itching    Thyroid Hormones Hives and Itching     Social History:   Social History     Socioeconomic History    Marital status: Married     Spouse name: Not on file    Number of children: Not on file    Years of education: Not on file    Highest education level: Not on file   Occupational History    Not on file   Tobacco Use    Smoking status: Never    Smokeless tobacco: Never   Vaping Use    Vaping Use: Never used   Substance and Sexual Activity    Alcohol use: Yes     Alcohol/week: 1.0 standard drink     Types: 1 Glasses of wine per week     Comment: once a week    Drug use: Never    Sexual activity: Not on file   Other Topics Concern    Not on file   Social History Narrative    Not on file     Social Determinants of Health     Financial Resource Strain: Not  on file   Food Insecurity: Not on file   Transportation Needs: Not on file   Physical Activity: Not on file   Stress: Not on file   Social Connections: Not on file   Intimate Partner Violence: Not on file   Housing Stability: Not on file     Family History: No family history on file.      Objective:     Vitals  There were no vitals filed for this visit.  Physical Exam  Physical Exam  Telemedicine visit  No results found for this visit on 02/26/22.    Assessnent/Plan:     Sandra Welch is postop TVH, salpingectomy, AMP repair    Doing well  1 week .   Activities and restrictions reviewed  Follow up in  2 weeks .     Shelda Altes, MD

## 2022-03-07 NOTE — Telephone Encounter (Signed)
Spoke to patient regarding her phone message. She had a Vaginal Hysterectomy with Bilateral Salpingectomy and vaginal anterior repair on 02/19/2022. She reports that she has had no pain for the past 10 days but about 3-4 days ago she started with pain on the right side of her pubic bone. She reports no nausea or vomiting. She reports that she is urinating well and having regular bowel movements. She does say that she has increased her walking post op but is not doing any lifting. She states that there is no visible bruising. She states that she feels like she used to when she was getting ready to start her menses.  Per Dr. Ladona Ridgel, patient is to continue to monitor. She can use NSAIDS as directed PRN for any discomfort. I instructed her to please call the office if anything changes before her next appointment with Dr. Ladona Ridgel on 03/14/2021. She verbalized understanding.

## 2022-03-07 NOTE — Telephone Encounter (Signed)
PT. Called, is having pain after surgery, feels like a huge bruise. Wondering why she is having pain now?

## 2022-03-14 ENCOUNTER — Ambulatory Visit: Admit: 2022-03-14 | Discharge: 2022-03-14 | Payer: BLUE CROSS/BLUE SHIELD | Attending: Obstetrics & Gynecology

## 2022-03-14 DIAGNOSIS — Z09 Encounter for follow-up examination after completed treatment for conditions other than malignant neoplasm: Secondary | ICD-10-CM

## 2022-03-14 NOTE — Progress Notes (Signed)
03/14/2022       HPI:     Name: Sandra Welch  Date of Birth: 15-Mar-1966  Date of surgery: 02/19/2022  Procedure: TVH and salpingectomy with anterior pair  CC:  post op evaluation.  HPI: Sandra Welch returns for a 3 week postop visit.   Reviewed history and postop course. In the interim she reports:  Voiding difficulty: No  Initiating stream normal: Yes  Force stream normal: Yes  Incontinence: no   Urgency without incontinence: No   Frequency without incontinence: No   Bowel functions: normal   Pain Controlled: Yes  Pathology: Benign    Past Medical History:   Past Medical History:   Diagnosis Date    DUB (dysfunctional uterine bleeding)     Ehlers-Danlos syndrome     connective tissue disorder    Migraines     Thyroid disease      Past Surgical History:   Past Surgical History:   Procedure Laterality Date    CESAREAN SECTION      CHOLECYSTECTOMY      EXCISIONAL HEMORRHOIDECTOMY      HYSTERECTOMY, VAGINAL N/A 02/19/2022    HYSTERECTOMY VAGINAL / BILATERAL SALPINGECTOMY performed by Shelda Altes, MD at [facility] MAIN OR    TONSILLECTOMY      TUBAL LIGATION      VAGINA SURGERY N/A 02/19/2022    VAGINAL REPAIR ANTERIOR performed by Shelda Altes, MD at [facility] MAIN OR     Current Medications:  Current Outpatient Medications   Medication Sig Dispense Refill    NALTREXONE HCL PO   0 Refill(s)      ibuprofen (ADVIL;MOTRIN) 600 MG tablet Take 1 tablet by mouth 3 times daily as needed for Pain 30 tablet 0    Magnesium 300 MG CAPS Take 1 tablet by mouth every morning      ARMOUR THYROID 120 MG tablet Take 120 mg by mouth nightly      ergocalciferol (ERGOCALCIFEROL) 1.25 MG (50000 UT) capsule Take 50,000 Units by mouth every morning Liquid in smoothie every morning       No current facility-administered medications for this visit.     Allergies:   Allergies   Allergen Reactions    Codeine Hives and Itching    Thyroid Hormones Hives and Itching     Social History:   Social History     Socioeconomic History    Marital status: Married      Spouse name: Not on file    Number of children: Not on file    Years of education: Not on file    Highest education level: Not on file   Occupational History    Not on file   Tobacco Use    Smoking status: Never    Smokeless tobacco: Never   Vaping Use    Vaping Use: Never used   Substance and Sexual Activity    Alcohol use: Yes     Alcohol/week: 1.0 standard drink     Types: 1 Glasses of wine per week     Comment: once a week    Drug use: Never    Sexual activity: Not on file   Other Topics Concern    Not on file   Social History Narrative    Not on file     Social Determinants of Health     Financial Resource Strain: Not on file   Food Insecurity: Not on file   Transportation Needs: Not on file   Physical Activity:  Not on file   Stress: Not on file   Social Connections: Not on file   Intimate Partner Violence: Not on file   Housing Stability: Not on file     Family History: No family history on file.      Objective:     Vitals  There were no vitals filed for this visit.  Physical Exam  Physical Exam  GU: vaginal incisions intact and cuff intact  POPQ examination: Aa/Ba -3 Ap/Bp -3 C-10 D-  Gh4  Pb4      Assessnent/Plan:     Stepfanie Yott is postop TVH/Salpingectomy and Anterior Repair    Doing well  3 weeks .   Activities and restrictions reviewed  Follow up in 4 weeks .   Continue pelvic rest for 3 weeks      No orders of the defined types were placed in this encounter.    No orders of the defined types were placed in this encounter.      Daralene Milch, RN

## 2022-03-19 ENCOUNTER — Inpatient Hospital Stay: Admit: 2022-03-19

## 2022-03-19 ENCOUNTER — Encounter

## 2022-03-19 DIAGNOSIS — R52 Pain, unspecified: Secondary | ICD-10-CM

## 2022-04-11 ENCOUNTER — Telehealth: Admit: 2022-04-11 | Discharge: 2022-04-11 | Payer: BLUE CROSS/BLUE SHIELD | Attending: Obstetrics & Gynecology

## 2022-04-11 DIAGNOSIS — Z09 Encounter for follow-up examination after completed treatment for conditions other than malignant neoplasm: Secondary | ICD-10-CM

## 2022-04-11 NOTE — Progress Notes (Signed)
04/11/2022       HPI:     Name: Sandra Welch  Date of Birth: 1966-06-13  Date of surgery: 02/19/2022  Procedure: 1.  Total fat hysterectomy bilateral salpingectomy 2.  Anterior repair  Sandra Welch was evaluated through a synchronous (real-time) audio-video encounter. The patient (or guardian if applicable) is aware that this is a billable service, which includes applicable co-pays. This Virtual Visit was conducted with patient's (and/or legal guardian's) consent.  Patient identification was verified, and a caregiver was present when appropriate.   The patient was located at Home: [address]  [place] [address]  Provider was located at [facility] (Appt Dept): [address]  [place],  [place] [address]  CC:  post op evaluation.  HPI: Sandra Welch returns for a 6 week postop visit.   Reviewed history and postop course. In the interim she reports:  Reports vaginal pressure on self-administered digital examination concerned that there is recurrence of anterior compartment prolapse  Voiding difficulty: No  Initiating stream normal: Yes  Force stream normal: Yes  Incontinence: stress present prior to surgery; reports postvoid dribbling upon standing  Urgency without incontinence: No   Frequency without incontinence: No   Bowel functions: normal   Pain Controlled: Yes  Pathology: Benign    Past Medical History:   Past Medical History:   Diagnosis Date    DUB (dysfunctional uterine bleeding)     Ehlers-Danlos syndrome     connective tissue disorder    Migraines     Thyroid disease      Past Surgical History:   Past Surgical History:   Procedure Laterality Date    CESAREAN SECTION      CHOLECYSTECTOMY      EXCISIONAL HEMORRHOIDECTOMY      HYSTERECTOMY, VAGINAL N/A 02/19/2022    HYSTERECTOMY VAGINAL / BILATERAL SALPINGECTOMY performed by Shelda Altes, MD at [facility] MAIN OR    TONSILLECTOMY      TUBAL LIGATION      VAGINA SURGERY N/A 02/19/2022    VAGINAL REPAIR ANTERIOR performed by Shelda Altes, MD at [facility]  MAIN OR     Current Medications:  Current Outpatient Medications   Medication Sig Dispense Refill    NALTREXONE HCL PO   0 Refill(s)      ibuprofen (ADVIL;MOTRIN) 600 MG tablet Take 1 tablet by mouth 3 times daily as needed for Pain 30 tablet 0    Magnesium 300 MG CAPS Take 1 tablet by mouth every morning      ARMOUR THYROID 120 MG tablet Take 120 mg by mouth nightly      ergocalciferol (ERGOCALCIFEROL) 1.25 MG (50000 UT) capsule Take 50,000 Units by mouth every morning Liquid in smoothie every morning       No current facility-administered medications for this visit.     Allergies:   Allergies   Allergen Reactions    Codeine Hives and Itching    Thyroid Hormones Hives and Itching     Social History:   Social History     Socioeconomic History    Marital status: Married     Spouse name: Not on file    Number of children: Not on file    Years of education: Not on file    Highest education level: Not on file   Occupational History    Not on file   Tobacco Use    Smoking status: Never    Smokeless tobacco: Never   Vaping Use  Vaping Use: Never used   Substance and Sexual Activity    Alcohol use: Yes     Alcohol/week: 1.0 standard drink     Types: 1 Glasses of wine per week     Comment: once a week    Drug use: Never    Sexual activity: Not on file   Other Topics Concern    Not on file   Social History Narrative    Not on file     Social Determinants of Health     Financial Resource Strain: Not on file   Food Insecurity: Not on file   Transportation Needs: Not on file   Physical Activity: Not on file   Stress: Not on file   Social Connections: Not on file   Intimate Partner Violence: Not on file   Housing Stability: Not on file     Family History: No family history on file.      Objective:     Vitals  There were no vitals filed for this visit.  Physical Exam  Telemedicine visit    Assessnent/Plan:     Sandra Welch is postop TVH/salpingectomy and anterior repair    Encouraged symptom diary  Recommend timed voids every 3-4  hours  Schedule for follow-up in office visit examination    Shelda Altes, MD

## 2022-04-29 ENCOUNTER — Ambulatory Visit: Admit: 2022-04-29 | Discharge: 2022-04-29 | Payer: BLUE CROSS/BLUE SHIELD | Attending: Obstetrics & Gynecology

## 2022-04-29 DIAGNOSIS — N3943 Post-void dribbling: Secondary | ICD-10-CM

## 2022-04-29 LAB — AMB POC URINALYSIS DIP STICK MANUAL W/O MICRO
Bilirubin, Urine, POC: NEGATIVE
Glucose, Urine, POC: NEGATIVE
Ketones, Urine, POC: NEGATIVE
Leukocyte Esterase, Urine, POC: NEGATIVE
Nitrite, Urine, POC: NEGATIVE
Protein, Urine, POC: NEGATIVE
Specific Gravity, Urine, POC: 1.01 (ref 1.001–1.035)
Urobilinogen, POC: 0.2
pH, Urine, POC: 6 (ref 4.6–8.0)

## 2022-04-29 NOTE — Addendum Note (Signed)
Addended by: Krista Blue on: 04/29/2022 02:40 PM     Modules accepted: Orders

## 2022-04-29 NOTE — Progress Notes (Addendum)
04/29/2022       HPI:     Name: Sandra Welch  Date of Birth: 10-Apr-1966  Date of surgery: 02/19/2022  Procedure:Vaginal Hysterectomy with bilateral salpingectomy, anterior repair.   CC:  post op evaluation.  HPI: Sandra Welch returns for a 9 week postop visit. Feels a bulge in her vagina.  Reviewed history and postop course. In the interim she reports:  Voiding difficulty: Yes  Initiating stream normal: No  Force stream normal: No  Incontinence: stress present prior to surgery Post void dribble   Urgency without incontinence: No   Frequency without incontinence: No   Bowel functions: constipation  Taking magnesium citrate  Pain Controlled: No  Pathology: benign    Past Medical History:   Past Medical History:   Diagnosis Date    DUB (dysfunctional uterine bleeding)     Ehlers-Danlos syndrome     connective tissue disorder    Migraines     Thyroid disease      Past Surgical History:   Past Surgical History:   Procedure Laterality Date    CESAREAN SECTION      CHOLECYSTECTOMY      EXCISIONAL HEMORRHOIDECTOMY      HYSTERECTOMY, VAGINAL N/A 02/19/2022    HYSTERECTOMY VAGINAL / BILATERAL SALPINGECTOMY performed by Shelda Altes, MD at [facility] MAIN OR    TONSILLECTOMY      TUBAL LIGATION      VAGINA SURGERY N/A 02/19/2022    VAGINAL REPAIR ANTERIOR performed by Shelda Altes, MD at [facility] MAIN OR     Current Medications:  Current Outpatient Medications   Medication Sig Dispense Refill    NALTREXONE HCL PO   0 Refill(s)      ibuprofen (ADVIL;MOTRIN) 600 MG tablet Take 1 tablet by mouth 3 times daily as needed for Pain 30 tablet 0    Magnesium 300 MG CAPS Take 1 tablet by mouth every morning      ARMOUR THYROID 120 MG tablet Take 120 mg by mouth nightly      ergocalciferol (ERGOCALCIFEROL) 1.25 MG (50000 UT) capsule Take 50,000 Units by mouth every morning Liquid in smoothie every morning       No current facility-administered medications for this visit.     Allergies:   Allergies   Allergen Reactions    Codeine Hives and Itching     Thyroid Hormones Hives and Itching    Levothyroxine Sodium Itching     Uncontrollable      Social History:   Social History     Socioeconomic History    Marital status: Married     Spouse name: Not on file    Number of children: Not on file    Years of education: Not on file    Highest education level: Not on file   Occupational History    Not on file   Tobacco Use    Smoking status: Never    Smokeless tobacco: Never   Vaping Use    Vaping Use: Never used   Substance and Sexual Activity    Alcohol use: Yes     Alcohol/week: 1.0 standard drink     Types: 1 Glasses of wine per week     Comment: once a week    Drug use: Never    Sexual activity: Not on file   Other Topics Concern    Not on file   Social History Narrative    Not on file     Social Determinants of Health  Financial Resource Strain: Not on file   Food Insecurity: Not on file   Transportation Needs: Not on file   Physical Activity: Not on file   Stress: Not on file   Social Connections: Not on file   Intimate Partner Violence: Not on file   Housing Stability: Not on file     Family History: No family history on file.      Objective:     Vitals  There were no vitals filed for this visit.  Physical Exam  Physical Exam  GU: vaginal incisions intact; there are circumferential hymenal remnants noted; digital examination reveals nontender levator ani  POPQ examination: Aa/Ba -3  Ap/Bp-3; C-10; D-;GH 3; PB4; TVL 10  Labs:  Urinalysis-in office: Glucose negative; bili negative; ketones negative; specific red 1.010; blood trace; pH 6.0; protein negative; uro 0.2; nitrite negative; leukocyte negative  Assessnent/Plan:     [name] is postop TVH, salpingectomy and anterior pair   Denovo post void dribbling  Plan:  1.  Recommend timed voids and double voiding techniques  2.  Progressive return to normal activity  3. Follow up in 6 weeks virtual visit to assess voiding pattern and symptoms    [name]) Ladona Ridgel, MD, [facility]  [facility] Urogynecology and Pelvic  Surgery  [address], [address]  [place], [place] [address]  Office: [phone]

## 2022-06-14 ENCOUNTER — Encounter: Attending: Obstetrics & Gynecology
